# Patient Record
Sex: Female | Born: 1990 | Race: Black or African American | Hispanic: No | Marital: Single | Smoking: Never smoker
Health system: Southern US, Community
[De-identification: ages and names within clinical notes are randomized; demographics above are authoritative.]

## PROBLEM LIST (undated history)

## (undated) ENCOUNTER — Inpatient Hospital Stay (HOSPITAL_COMMUNITY): Payer: Self-pay

## (undated) DIAGNOSIS — B999 Unspecified infectious disease: Secondary | ICD-10-CM

## (undated) DIAGNOSIS — N739 Female pelvic inflammatory disease, unspecified: Secondary | ICD-10-CM

## (undated) DIAGNOSIS — R011 Cardiac murmur, unspecified: Secondary | ICD-10-CM

## (undated) HISTORY — PX: NO PAST SURGERIES: SHX2092

---

## 2013-05-26 NOTE — L&D Delivery Note (Signed)
Delivery Note I was called at 2336, cervix was still 2-3 cm, membranes intact, she had just received her epidural.  I was called at 0037 and told she had SROM and was completely dilated, I left my house within minutes and came directly to the hospital.  When I entered the room, Dr. Macon LargeAnyanwu had delivered Baby A and the baby was on mom's belly.  I delivered baby B and the placenta.     Tracey Hill, Tracey Hill [[id number]][[id number]]  At 12:47 AM a viable female was delivered via Vaginal, Spontaneous Delivery (Presentation: ;  ).  APGAR: 9, 9; weight pending.   Placenta status: Intact, Spontaneous.  Cord:  with the following complications: None.  Anesthesia: Epidural  Episiotomy: None Lacerations: 1st degree Suture Repair: none Est. Blood Loss (mL): 500    Tracey Hill, Tracey Hill [[id number]][[id number]]  At 12:58 AM a viable female was delivered via Vaginal, Spontaneous Delivery (Presentation: Left Occiput Posterior).  APGAR: 8, 9; weight pending.   Placenta status: Intact, Spontaneous.  Cord:  with the following complications: None.  Anesthesia: Epidural  Episiotomy: None Lacerations: 1st degree Suture Repair: none Est. Blood Loss (mL): 500   Mom to postpartum.   Baby A to Couplet care / Skin to Skin.   Baby B to Couplet care / Skin to Skin.  [name] D 03/15/2014, 1:16 AM

## 2013-08-15 ENCOUNTER — Emergency Department (HOSPITAL_COMMUNITY)
Admission: EM | Admit: 2013-08-15 | Discharge: 2013-08-15 | Disposition: A | Discharge status: Home / Self Care | Payer: 59 | Attending: Emergency Medicine | Admitting: Emergency Medicine

## 2013-08-15 ENCOUNTER — Encounter (HOSPITAL_COMMUNITY): Payer: Self-pay | Admitting: Emergency Medicine

## 2013-08-15 DIAGNOSIS — R42 Dizziness and giddiness: Secondary | ICD-10-CM | POA: Diagnosis not present

## 2013-08-15 DIAGNOSIS — Z792 Long term (current) use of antibiotics: Secondary | ICD-10-CM | POA: Insufficient documentation

## 2013-08-15 DIAGNOSIS — O210 Mild hyperemesis gravidarum: Secondary | ICD-10-CM | POA: Diagnosis present

## 2013-08-15 DIAGNOSIS — O2390 Unspecified genitourinary tract infection in pregnancy, unspecified trimester: Secondary | ICD-10-CM | POA: Diagnosis not present

## 2013-08-15 DIAGNOSIS — Z79899 Other long term (current) drug therapy: Secondary | ICD-10-CM | POA: Insufficient documentation

## 2013-08-15 DIAGNOSIS — R5383 Other fatigue: Secondary | ICD-10-CM

## 2013-08-15 DIAGNOSIS — N390 Urinary tract infection, site not specified: Secondary | ICD-10-CM | POA: Insufficient documentation

## 2013-08-15 DIAGNOSIS — O9989 Other specified diseases and conditions complicating pregnancy, childbirth and the puerperium: Secondary | ICD-10-CM | POA: Diagnosis not present

## 2013-08-15 DIAGNOSIS — R5381 Other malaise: Secondary | ICD-10-CM | POA: Diagnosis not present

## 2013-08-15 LAB — CBC WITH DIFFERENTIAL/PLATELET
BASOS ABS: 0 10*3/uL (ref 0.0–0.1)
BASOS PCT: 0 % (ref 0–1)
Eosinophils Absolute: 0 10*3/uL (ref 0.0–0.7)
Eosinophils Relative: 0 % (ref 0–5)
HEMATOCRIT: 38.8 % (ref 36.0–46.0)
Hemoglobin: 14.2 g/dL (ref 12.0–15.0)
LYMPHS PCT: 24 % (ref 12–46)
Lymphs Abs: 2 10*3/uL (ref 0.7–4.0)
MCH: 30.7 pg (ref 26.0–34.0)
MCHC: 36.6 g/dL — AB (ref 30.0–36.0)
MCV: 83.8 fL (ref 78.0–100.0)
Monocytes Absolute: 0.6 10*3/uL (ref 0.1–1.0)
Monocytes Relative: 7 % (ref 3–12)
NEUTROS ABS: 5.5 10*3/uL (ref 1.7–7.7)
NEUTROS PCT: 68 % (ref 43–77)
Platelets: 278 10*3/uL (ref 150–400)
RBC: 4.63 MIL/uL (ref 3.87–5.11)
RDW: 12.6 % (ref 11.5–15.5)
WBC: 8.1 10*3/uL (ref 4.0–10.5)

## 2013-08-15 LAB — URINALYSIS, ROUTINE W REFLEX MICROSCOPIC
Bilirubin Urine: NEGATIVE
Glucose, UA: NEGATIVE mg/dL
Hgb urine dipstick: NEGATIVE
Ketones, ur: >80 mg/dL — AB
NITRITE: NEGATIVE
PH: 5.5 (ref 5.0–8.0)
Protein, ur: 30 mg/dL — AB
SPECIFIC GRAVITY, URINE: 1.034 — AB (ref 1.005–1.030)
UROBILINOGEN UA: 1 mg/dL (ref 0.0–1.0)

## 2013-08-15 LAB — COMPREHENSIVE METABOLIC PANEL
ALT: 16 U/L (ref 0–35)
AST: 16 U/L (ref 0–37)
Albumin: 4.4 g/dL (ref 3.5–5.2)
Alkaline Phosphatase: 56 U/L (ref 39–117)
BUN: 9 mg/dL (ref 6–23)
CO2: 23 meq/L (ref 19–32)
Calcium: 10 mg/dL (ref 8.4–10.5)
Chloride: 101 mEq/L (ref 96–112)
Creatinine, Ser: 0.61 mg/dL (ref 0.50–1.10)
GLUCOSE: 102 mg/dL — AB (ref 70–99)
Potassium: 3.9 mEq/L (ref 3.7–5.3)
SODIUM: 140 meq/L (ref 137–147)
TOTAL PROTEIN: 8.6 g/dL — AB (ref 6.0–8.3)
Total Bilirubin: 0.8 mg/dL (ref 0.3–1.2)

## 2013-08-15 LAB — URINE MICROSCOPIC-ADD ON

## 2013-08-15 LAB — PREGNANCY, URINE: Preg Test, Ur: POSITIVE — AB

## 2013-08-15 MED ORDER — CEPHALEXIN 500 MG PO CAPS: 500.0000 mg | ORAL_CAPSULE | Freq: Two times a day (BID) | ORAL | Status: DC

## 2013-08-15 MED ORDER — ONDANSETRON 4 MG PO TBDP: 4.0000 mg | ORAL_TABLET | Freq: Three times a day (TID) | ORAL | Status: DC | PRN

## 2013-08-15 MED ORDER — SODIUM CHLORIDE 0.9 % IV BOLUS (SEPSIS)
1000.0000 mL | Freq: Once | INTRAVENOUS | Status: AC
  Administered 2013-08-15: 1000 mL via INTRAVENOUS

## 2013-08-15 MED ORDER — ONDANSETRON HCL 4 MG/2ML IJ SOLN
4.0000 mg | INTRAMUSCULAR | Status: AC
  Administered 2013-08-15: 4 mg via INTRAVENOUS
  Filled 2013-08-15: qty 2

## 2013-08-15 MED ORDER — PROMETHAZINE HCL 25 MG/ML IJ SOLN
12.5000 mg | Freq: Once | INTRAMUSCULAR | Status: AC
  Administered 2013-08-15: 12.5 mg via INTRAVENOUS
  Filled 2013-08-15: qty 1

## 2013-08-15 MED ORDER — PROMETHAZINE HCL 25 MG RE SUPP: 25.0000 mg | Freq: Four times a day (QID) | RECTAL | Status: DC | PRN

## 2013-08-15 NOTE — ED Notes (Signed)
Patient given gingerale and instructed to take small and slow sips

## 2013-08-15 NOTE — ED Notes (Signed)
Pt updated on wait time.  

## 2013-08-15 NOTE — ED Notes (Signed)
IV attempted by 2 RNs without success IV team at bedside.

## 2013-08-15 NOTE — ED Notes (Signed)
Pt able to keep fluids down.  

## 2013-08-15 NOTE — ED Provider Notes (Signed)
CSN: [id number]     Arrival date & time 08/15/13  1635 History   First MD Initiated Contact with Patient 08/15/13 2009     Chief Complaint  Patient presents with  . Emesis During Pregnancy     (Consider location/radiation/quality/duration/timing/severity/associated sxs/prior Treatment) HPI Comments: Patient is a 23 year old female who presents for nausea and vomiting x4 days. Patient states that she is [redacted] weeks pregnant, LMP 07/05/13, and that she believes her symptoms are associated with her pregnancy. Patient states she had the same symptoms with a previous pregnancy. Patient has not taken anything for symptoms. She states that any oral intake aggravates her nausea and emesis. Patient states symptoms have been associated with lightheadedness, fatigue, and decreased urinary output. She states that she does not currently have an OB/GYN and has had no prenatal care for this pregnancy. Patient denies associated fever, chest pain, abdominal pain, vaginal bleeding or discharge, dysuria or hematuria, numbness/tingling, weakness, and syncope.  The history is provided by the patient. No language interpreter was used.    History reviewed. No pertinent past medical history. History reviewed. No pertinent past surgical history. No family history on file. History  Substance Use Topics  . Smoking status: Never Smoker   . Smokeless tobacco: Not on file  . Alcohol Use: No   OB History   Grav Para Term Preterm Abortions TAB SAB Ect Mult Living                  Review of Systems  Constitutional: Positive for fatigue. Negative for fever.  Gastrointestinal: Positive for nausea and vomiting. Negative for abdominal pain and diarrhea.  Genitourinary: Negative for dysuria, hematuria, vaginal bleeding and vaginal discharge.  Neurological: Positive for light-headedness. Negative for dizziness, weakness and numbness.  All other systems reviewed and are negative.     Allergies  Review of patient's  allergies indicates no known allergies.  Home Medications   Current Outpatient Rx  Name  Route  Sig  Dispense  Refill  . Prenatal Vit-Fe Fumarate-FA (PRENATAL PO)   Oral   Take 1 tablet by mouth daily.         . cephALEXin (KEFLEX) 500 MG capsule   Oral   Take 1 capsule (500 mg total) by mouth 2 (two) times daily.   14 capsule   0   . ondansetron (ZOFRAN ODT) 4 MG disintegrating tablet   Oral   Take 1 tablet (4 mg total) by mouth every 8 (eight) hours as needed for nausea or vomiting.   15 tablet   0   . promethazine (PHENERGAN) 25 MG suppository   Rectal   Place 1 suppository (25 mg total) rectally every 6 (six) hours as needed for nausea or vomiting (if no relief from zofran).   12 each   0    BP 101/72  Pulse 82  Temp(Src) 98.5 F (36.9 C) (Oral)  Resp 17  Wt 150 lb (68.04 kg)  SpO2 100%  LMP 07/03/2013  Physical Exam  Nursing note and vitals reviewed. Constitutional: She is oriented to person, place, and time. She appears well-developed and well-nourished. No distress.  Nontoxic and nonseptic appearing  HENT:  Head: Normocephalic and atraumatic.  Mouth/Throat: No oropharyngeal exudate.  Oropharynx clear, mucous membranes dry  Eyes: Conjunctivae and EOM are normal. Pupils are equal, round, and reactive to light. No scleral icterus.  Neck: Normal range of motion. Neck supple.  Cardiovascular: Normal rate, regular rhythm and normal heart sounds.   Pulmonary/Chest: Effort normal and  breath sounds normal. No respiratory distress. She has no wheezes. She has no rales.  Abdominal: Soft. She exhibits no distension and no mass. There is no tenderness. There is no rebound and no guarding.  No tenderness or peritoneal signs  Musculoskeletal: Normal range of motion.  Neurological: She is alert and oriented to person, place, and time.  Skin: Skin is warm and dry. No rash noted. She is not diaphoretic. No erythema. No pallor.  Psychiatric: She has a normal mood and  affect. Her behavior is normal.    ED Course  Procedures (including critical care time) Labs Review Labs Reviewed  COMPREHENSIVE METABOLIC PANEL - Abnormal; Notable for the following:    Glucose, Bld 102 (*)    Total Protein 8.6 (*)    All other components within normal limits  CBC WITH DIFFERENTIAL - Abnormal; Notable for the following:    MCHC 36.6 (*)    All other components within normal limits  URINALYSIS, ROUTINE W REFLEX MICROSCOPIC - Abnormal; Notable for the following:    Color, Urine AMBER (*)    APPearance CLOUDY (*)    Specific Gravity, Urine 1.034 (*)    Ketones, ur >80 (*)    Protein, ur 30 (*)    Leukocytes, UA MODERATE (*)    All other components within normal limits  PREGNANCY, URINE - Abnormal; Notable for the following:    Preg Test, Ur POSITIVE (*)    All other components within normal limits  URINE MICROSCOPIC-ADD ON - Abnormal; Notable for the following:    Bacteria, UA FEW (*)    All other components within normal limits   Imaging Review No results found.   EKG Interpretation None      MDM   Final diagnoses:  Hyperemesis gravidarum  UTI (urinary tract infection)    23 year old female presents for nausea and vomiting x4 days. Symptoms associated with pregnancy. Patient states she is [redacted] weeks pregnant. She endorses a history of same with prior pregnancies. No fever, syncope, abdominal pain, vaginal complaints, urinary symptoms, or diarrhea. Physical exam significant only for dry mucous membranes. No abdominal tenderness or peritoneal signs appreciated. Labs without leukocytosis, anemia, electrolyte imbalance, or abnormal liver or kidney function. Urinary tract infection suggests potential infection. Patient will be treated with outpatient course of Keflex.  Nausea and emesis treated in ED with Zofran and Phenergan. Patient also hydrated with 1 L IVF. Patient has been able to tolerate fluids by mouth without emesis. She is stable on orthostatic  testing. Patient stable and appropriate for discharge with instruction to followup with women's outpatient clinic for prenatal care. Will prescribe Zofran for persistent nausea/emesis. Patient also given Phenergan suppositories should she get no relief from Zofran. Return precautions provided and patient agreeable to plan with no unaddressed concerns.   Filed Vitals:   08/15/13 2234 08/15/13 2236 08/15/13 2236 08/15/13 2237  BP: 100/51 101/53 100/61 101/72  Pulse: 80 73 72 82  Temp:      TempSrc:      Resp:      Weight:      SpO2: 100%          Antony MaduraKelly Mariana Goytia, PA-C 08/15/13 2314

## 2013-08-15 NOTE — ED Notes (Signed)
PT reports nausea for about a week and vomiting beginning on Friday. PT reports not being able to keep anything down since Friday. PT states she is [redacted] weeks pregnant and had the same symptoms with previous pregnancy. PT states she was given PR phenergan with 1st pregnancy to stop n/v. PT reports urination x1 today with dark amber color

## 2013-08-15 NOTE — ED Notes (Signed)
2 failed attempts at IV start by RN's.  The IV team has been paged.

## 2013-08-15 NOTE — ED Notes (Signed)
This RN attempted IV access x1 

## 2013-08-15 NOTE — Discharge Instructions (Signed)
Take Keflex for urinary tract infection. Takes Zofran as prescribed for nausea. He may take Phenergan as prescribed Zofran has not provided relief. Drink fluids in small amounts. Avoid milk products and greasy/fatty/spicy foods. Follow up with women's outpatient clinic for prenatal care.  Urinary Tract Infection A urinary tract infection (UTI) can occur any place along the urinary tract. The tract includes the kidneys, ureters, bladder, and urethra. A type of germ called bacteria often causes a UTI. UTIs are often helped with antibiotic medicine.  HOME CARE   If given, take antibiotics as told by your doctor. Finish them even if you start to feel better.  Drink enough fluids to keep your pee (urine) clear or pale yellow.  Avoid tea, drinks with caffeine, and bubbly (carbonated) drinks.  Pee often. Avoid holding your pee in for a long time.  Pee before and after having sex (intercourse).  Wipe from front to back after you poop (bowel movement) if you are a woman. Use each tissue only once. GET HELP RIGHT AWAY IF:   You have back pain.  You have lower belly (abdominal) pain.  You have chills.  You feel sick to your stomach (nauseous).  You throw up (vomit).  Your burning or discomfort with peeing does not go away.  You have a fever.  Your symptoms are not better in 3 days. MAKE SURE YOU:   Understand these instructions.  Will watch your condition.  Will get help right away if you are not doing well or get worse. Document Released: 10/29/2007 Document Revised: 02/04/2012 Document Reviewed: 12/11/2011 [facility] Patient Information 2014 [place], [place]. Hyperemesis Gravidarum Hyperemesis gravidarum is a severe form of nausea and vomiting that happens during pregnancy. Hyperemesis is worse than morning sickness. It may cause you to have nausea or vomiting all day for many days. It may keep you from eating and drinking enough food and liquids. Hyperemesis usually occurs during the  first half (the first 20 weeks) of pregnancy. It often goes away once a woman is in her second half of pregnancy. However, sometimes hyperemesis continues through an entire pregnancy.  CAUSES  The cause of this condition is not completely known but is thought to be related to changes in the body's hormones when pregnant. It could be from the high level of the pregnancy hormone or an increase in estrogen in the body.  SIGNS AND SYMPTOMS   Severe nausea and vomiting.  Nausea that does not go away.  Vomiting that does not allow you to keep any food down.  Weight loss and body fluid loss (dehydration).  Having no desire to eat or not liking food you have previously enjoyed. DIAGNOSIS  Your health care provider will do a physical exam and ask you about your symptoms. He or she may also order blood tests and urine tests to make sure something else is not causing the problem.  TREATMENT  You may only need medicine to control the problem. If medicines do not control the nausea and vomiting, you will be treated in the hospital to prevent dehydration, increased acid in the blood (acidosis), weight loss, and changes in the electrolytes in your body that may harm the unborn baby (fetus). You may need IV fluids.  HOME CARE INSTRUCTIONS   Only take over-the-counter or prescription medicines as directed by your health care provider.  Try eating a couple of dry crackers or toast in the morning before getting out of bed.  Avoid foods and smells that upset your stomach.  Avoid fatty and spicy foods.  Eat 5 6 small meals a day.  Do not drink when eating meals. Drink between meals.  For snacks, eat high-protein foods, such as cheese.  Eat or suck on things that have ginger in them. Ginger helps nausea.  Avoid food preparation. The smell of food can spoil your appetite.  Avoid iron pills and iron in your multivitamins until after 3 4 months of being pregnant. However, consult with your health care  provider before stopping any prescribed iron pills. SEEK MEDICAL CARE IF:   Your abdominal pain increases.  You have a severe headache.  You have vision problems.  You are losing weight. SEEK IMMEDIATE MEDICAL CARE IF:   You are unable to keep fluids down.  You vomit blood.  You have constant nausea and vomiting.  You have excessive weakness.  You have extreme thirst.  You have dizziness or fainting.  You have a fever or persistent symptoms for more than 2 3 days.  You have a fever and your symptoms suddenly get worse. MAKE SURE YOU:   Understand these instructions.  Will watch your condition.  Will get help right away if you are not doing well or get worse. Document Released: 05/12/2005 Document Revised: 03/02/2013 Document Reviewed: 12/22/2012 [facility] Patient Information 2014 [place], [place].  Emergency Department Resource Guide 1) Find a Doctor and Pay Out of Pocket Although you won't have to find out who is covered by your insurance plan, it is a good idea to ask around and get recommendations. You will then need to call the office and see if the doctor you have chosen will accept you as a new patient and what types of options they offer for patients who are self-pay. Some doctors offer discounts or will set up payment plans for their patients who do not have insurance, but you will need to ask so you aren't surprised when you get to your appointment.  2) Contact Your Local Health Department Not all health departments have doctors that can see patients for sick visits, but many do, so it is worth a call to see if yours does. If you don't know where your local health department is, you can check in your phone book. The CDC also has a tool to help you locate your state's health department, and many state websites also have listings of all of their local health departments.  3) Find a Walk-in Clinic If your illness is not likely to be very severe or complicated, you may  want to try a walk in clinic. These are popping up all over the country in pharmacies, drugstores, and shopping centers. They're usually staffed by nurse practitioners or physician assistants that have been trained to treat common illnesses and complaints. They're usually fairly quick and inexpensive. However, if you have serious medical issues or chronic medical problems, these are probably not your best option.  No Primary Care Doctor: - Call Health Connect at  [phone] - they can help you locate a primary care doctor that  accepts your insurance, provides certain services, etc. - Physician Referral Service- [phone]  Chronic Pain Problems: Organization         Address  Phone   Notes  [facility]  [phone] Patients need to be referred by their primary care doctor.   Medication Assistance: Organization         Address  Phone   Notes  [facility] Medication Assistance Program [address] [place], [place] [address] [phone]) [phone] --Must be a resident of [place] -- Must have NO insurance coverage whatsoever (no Medicaid/ Medicare, etc.) -- The pt. MUST have a primary care doctor that directs their care regularly and follows them in the community   Med[facility]ist  [phone]   [employer]  [phone]    Agencies that provide inexpensive medical care: Organization         Address  Phone   Notes  [facility]  [phone]) [phone]   [facility]    [phone]   [facility] [address] [place], [place] [address] [phone]) [phone]   [facility] [address], [place] [phone]) [phone]   [facility]anned Parenthood    [phone](336) [phone]   [facility]    [phone]   [facility]  [address], [place] Phone:  [phone], Fax:  [phone]) [phone] Hours of Operation:  9 am - 6 pm, M-F.  Also accepts Medicaid/Medicare and self-pay.   [facility]  [address], [place] Phone: [phone], Fax: [phone]. Hours of Operation:  8:30 am - 5:30 pm, M-F.  Also accepts Medicaid and self-pay.  [facility] [place] [address], [place] Phone: [phone]) [phone]   [facility] [address], [place], [place] [phone], Ext. [phone] Mondays & Thursdays: 7-9 AM.  First 15 patients are seen on a first come, first serve basis.    Medicaid-accepting [facility] Providers:  Organization         Address  Phone   Notes  [facility] [address], [place] [phone]) [phone] Also accepts self-pay patients.  [facility] [address], [place]  [phone]) [phone]   [facility] [address], [place] [phone]   [facility] [address] [place] [address], [place] [phone]) [phone]   Renaye RakersVeita Bland [address], [place]   [phone] Only accepts [place] Access [place] patients after they have their name applied to their card.   Self-Pay (no insurance) in [place]:  Organization         Address  Phone   Notes  Sickle Cell Patients, [facility] [address], [place] [phone]) [phone]   [facility] [address], [place] [phone]   [facility] [place]  [address], [place] [phone]) [phone]   [facility]/Dr. Osei-Bonsu  [address] [place] [address], [place] or [phone], [place] [phone] Phone number for both [place] and [place] locations is the same.  [facility] [address], [place] [phone]   [facility] [address] [place] [address], [place] or [address] [phone]) [phone]   [facility] [address], [place] [phone]) [phone], phone; [phone](336)  [phone], fax Sees patients 1st and 3rd Saturday of every month.  Must not qualify for public or private insurance (i.e. Medicaid, Medicare, [place] Health Choice, Veterans' Benefits)  Household income should be no more than 200% of the poverty level The clinic cannot treat you if you are pregnant or think you are pregnant  Sexually transmitted diseases are not treated at the clinic.    Dental Care:  Organization         Address  Phone  Notes  [facility] Department of [facility] [address] [place], [place] [phone] Accepts children up to age 85 who are enrolled in [place] or [place] Health Choice; pregnant women with a Medicaid card; and children who have applied for Medicaid or [place] Health Choice, but were declined, whose parents can pay a reduced fee at time of service.  [facility]  [address], [place] [phone] Accepts children up to age 69 who are enrolled in [place] or [place] Health Choice; pregnant women with a Medicaid card; and children who have applied for Medicaid or [place] Health Choice, but were declined, whose parents can pay a reduced fee at time of service.  [facility]  [address] [place], [place] [phone] Patients are seen by appointment only. Walk-ins are not accepted. [facility] will see patients 18 years of age and older. Monday - Tuesday (8am-5pm) Most Wednesdays (8:30-5pm) $30 per visit, cash only  [facility] Adult Dental Access PROGRAM  [address], [facility] [phone] Patients are seen by appointment only. Walk-ins are not accepted. [facility] will see patients 21 years of age and older. One Wednesday Evening (Monthly: Volunteer Based).  $30 per visit, cash only  [employer]  [phone] for adults; Children under age 72, call Graduate Pediatric Dentistry at [phone]. Children aged 61-14, please call [phone] to request a pediatric application.  Dental services are provided in all areas of dental care including fillings, crowns and bridges, complete and partial dentures, implants, gum treatment, root canals, and extractions. Preventive care is also provided. Treatment is provided to both adults and children. Patients are selected via a lottery and there is often a waiting list.   [facility] [address] [place]  [phone] [email or web address]   [facility] [address] [place], [place] [phone], Ext. [phone] Second and Fourth Thursday of each month, opens at 6:30 AM; Clinic ends at 9 AM.  Patients are seen on a first-come first-served basis, and a limited number are seen during each clinic.   [facility]  [address] [place], [place] [phone]   Eligibility Requirements You must have lived in [place], [place], or [place] counties for at least the last three months.   You cannot be eligible for state or federal sponsored [place], including [employer], [place], or [employer].   You generally cannot be eligible for healthcare insurance through your employer.    How to apply: Eligibility screenings are held every Tuesday and Wednesday afternoon from 1:00 pm until 4:00 pm. You do not need an appointment for the interview!  [facility] [address] [place], [place] [phone]   [facility] Health Department  [phone]   [facility] Health Department  [phone]   [facility] Health Department  [phone]    Behavioral Health Resources in the Community: Intensive Outpatient Programs Organization         Address  Phone  Notes  [facility] Services [address], [place], [place] [phone]   [facility] Outpatient [address] [place], [place] [phone]   [facility] [address], [place], [place]  [phone]    [facility] 352 Mental Health [address]  [place],  [place] [phone] or [phone]   Substance Abuse Resources Organization         Address  Phone  Notes  [facility] Drug Services  [phone]   [facility]  [phone]   [facility] [place]  [phone]   Floydene Flock  [phone]   Residential & Outpatient Substance Abuse Program  4121956210   Psychological Services Organization         Address  Phone  Notes  [facility] Behavioral Health  [phone]   [facility] Services  [phone]   [facility] Mental Health [address], [place] [phone] or [phone]    Mobile Crisis Teams Organization         Address  Phone  Notes  [facility], Mobile Crisis Care Unit  [phone]   [facility]  [address] [place], [place] [phone]   Doristine Locks [address] [place] [phone]    Self-Help/Support Groups Organization         Address  Phone             Notes  [facility] [place] - variety of support groups  [phone]- I7437963 Call for more information  Narcotics Anonymous (NA), [facility] [address], [employer] [facility]  2 meetings at this location   Statistician         Address  Phone  Notes  [facility] [address],    [place]  [phone]   [facility]  [address], [place] [address], [place], [place] [phone]   [facility] [address] [place], [place] [phone] Admissions: 8am-3pm M-F  [facility] [address],    [place], [place] [phone]   [facility] [address] [place], [employer], [place] [phone]   [facility] [address],  [place], [place] [phone]   [facility] - Intensive Outpatient [address], [place], [place] [phone]   [facility] ([facility] [address]  [place], [place] [phone] or [phone]   [facility] ([facility]) [address] [place] [address], [place], [place] [phone] Accepts Medicaid  [facility] [address]  [place] [phone] Substance Abuse/Addiction Treatment   [facility] Organization         Address  Phone  Notes  [facility]  [phone]   Angie Fava, PhD [address] [place], [place]   [phone] or [phone]   [facility]   [address] [place], [place] [phone]   [facility] [address] [place], [place] [phone] Insurance/Medicaid/sponsorship through [facility] and Families [address]                                    [place], [place] [phone] Therapy/tele-psych/case  [facility] [address], [place] [phone]    Dr. Lolly Mustache  [phone]   [facility]  [employer] [facility] Dept. 1) [address] [place] 2) [address], [place] 3)  [address], [place] [phone]  [phone]   [facility] Child Abuse Hotline (726)506-2029 or [phone] (After Hours)

## 2013-08-19 NOTE — ED Provider Notes (Signed)
Medical screening examination/treatment/procedure(s) were performed by non-physician practitioner and as supervising physician I was immediately available for consultation/collaboration.  Jaevin Medearis T Lennart Gladish, MD 08/19/13 0754 

## 2013-09-01 ENCOUNTER — Inpatient Hospital Stay (HOSPITAL_COMMUNITY): Payer: 59

## 2013-09-01 ENCOUNTER — Inpatient Hospital Stay (HOSPITAL_COMMUNITY)
Admission: AD | Admit: 2013-09-01 | Discharge: 2013-09-01 | Disposition: A | Discharge status: Home / Self Care | Payer: 59 | Source: Ambulatory Visit | Attending: Family Medicine | Admitting: Family Medicine

## 2013-09-01 ENCOUNTER — Encounter (HOSPITAL_COMMUNITY): Payer: Self-pay | Admitting: *Deleted

## 2013-09-01 DIAGNOSIS — N390 Urinary tract infection, site not specified: Secondary | ICD-10-CM

## 2013-09-01 DIAGNOSIS — O219 Vomiting of pregnancy, unspecified: Secondary | ICD-10-CM

## 2013-09-01 DIAGNOSIS — O210 Mild hyperemesis gravidarum: Secondary | ICD-10-CM | POA: Insufficient documentation

## 2013-09-01 DIAGNOSIS — IMO0001 Reserved for inherently not codable concepts without codable children: Secondary | ICD-10-CM

## 2013-09-01 DIAGNOSIS — O2390 Unspecified genitourinary tract infection in pregnancy, unspecified trimester: Secondary | ICD-10-CM | POA: Insufficient documentation

## 2013-09-01 DIAGNOSIS — O30009 Twin pregnancy, unspecified number of placenta and unspecified number of amniotic sacs, unspecified trimester: Secondary | ICD-10-CM | POA: Insufficient documentation

## 2013-09-01 HISTORY — DX: Female pelvic inflammatory disease, unspecified: N73.9

## 2013-09-01 LAB — URINALYSIS, ROUTINE W REFLEX MICROSCOPIC
Bilirubin Urine: NEGATIVE
GLUCOSE, UA: NEGATIVE mg/dL
Ketones, ur: >80 mg/dL — AB
NITRITE: NEGATIVE
Protein, ur: 30 mg/dL — AB
Urobilinogen, UA: 0.2 mg/dL (ref 0.0–1.0)
pH: 6 (ref 5.0–8.0)

## 2013-09-01 LAB — BASIC METABOLIC PANEL
BUN: 8 mg/dL (ref 6–23)
CALCIUM: 9.4 mg/dL (ref 8.4–10.5)
CO2: 21 meq/L (ref 19–32)
Chloride: 100 mEq/L (ref 96–112)
Creatinine, Ser: 0.48 mg/dL — ABNORMAL LOW (ref 0.50–1.10)
GFR calc Af Amer: >90 mL/min (ref >90)
GFR calc non Af Amer: >90 mL/min (ref >90)
GLUCOSE: 79 mg/dL (ref 70–99)
Potassium: 3.5 mEq/L — ABNORMAL LOW (ref 3.7–5.3)
SODIUM: 137 meq/L (ref 137–147)

## 2013-09-01 LAB — CBC
HCT: 35.3 % — ABNORMAL LOW (ref 36.0–46.0)
Hemoglobin: 12.8 g/dL (ref 12.0–15.0)
MCH: 30.4 pg (ref 26.0–34.0)
MCHC: 36.3 g/dL — ABNORMAL HIGH (ref 30.0–36.0)
MCV: 83.8 fL (ref 78.0–100.0)
Platelets: 242 10*3/uL (ref 150–400)
RBC: 4.21 MIL/uL (ref 3.87–5.11)
RDW: 12.5 % (ref 11.5–15.5)
WBC: 7 10*3/uL (ref 4.0–10.5)

## 2013-09-01 LAB — URINE MICROSCOPIC-ADD ON

## 2013-09-01 MED ORDER — METOCLOPRAMIDE HCL 5 MG/ML IJ SOLN
10.0000 mg | Freq: Once | INTRAMUSCULAR | Status: AC
  Administered 2013-09-01: 10 mg via INTRAVENOUS
  Filled 2013-09-01: qty 2

## 2013-09-01 MED ORDER — PROMETHAZINE HCL 25 MG PO TABS: 25.0000 mg | ORAL_TABLET | Freq: Once | ORAL | Status: DC

## 2013-09-01 MED ORDER — NITROFURANTOIN MONOHYD MACRO 100 MG PO CAPS: 100.0000 mg | ORAL_CAPSULE | Freq: Two times a day (BID) | ORAL | Status: DC

## 2013-09-01 MED ORDER — PROMETHAZINE HCL 25 MG PO TABS: 25.0000 mg | ORAL_TABLET | Freq: Four times a day (QID) | ORAL | Status: DC | PRN

## 2013-09-01 MED ORDER — LACTATED RINGERS IV BOLUS (SEPSIS)
1000.0000 mL | Freq: Once | INTRAVENOUS | Status: AC
  Administered 2013-09-01: 1000 mL via INTRAVENOUS

## 2013-09-01 MED ORDER — PROMETHAZINE HCL 25 MG/ML IJ SOLN
25.0000 mg | Freq: Once | INTRAMUSCULAR | Status: AC
  Administered 2013-09-01: 25 mg via INTRAVENOUS
  Filled 2013-09-01: qty 1

## 2013-09-01 NOTE — MAU Note (Addendum)
[facility], was dx as preg and with hyperemesis.  Was given zofran- which she is not taking because she is afraid to take; and phenergan supp- doesn't seem to work, makes her have a BM so it doesn't stay in .  Can't keep anything down

## 2013-09-01 NOTE — MAU Provider Note (Signed)
Attestation of Attending Supervision of Advanced Practitioner (PA/CNM/NP): Evaluation and management procedures were performed by the Advanced Practitioner under my supervision and collaboration.  I have reviewed the Advanced Practitioner's note and chart, and I agree with the management and plan.  Reva Boresanya S Killian Ress, MD [facility] Attending 09/01/2013 11:41 PM

## 2013-09-01 NOTE — MAU Provider Note (Signed)
CSN: [id number]     Arrival date & time 09/01/13  1811 History   None    Chief Complaint  Patient presents with  . Nausea     (Consider location/radiation/quality/duration/timing/severity/associated sxs/prior Treatment) HPI Tracey Hill is a 23 y.o. G1P0 @ 3029w4d who presents to MAU with nausea and vomiting in pregnancy. [facility] and treated with IV fluids, and IV medications for nausea and vomiting. She was discharge home with Zofran tablets and Phenergan Supp. The patient was doing well until she saw the commercials on TV about Zofran and birth defects so she stopped the medication. She can not keep the suppository in. She states that every time she puts it in she feels the need to have a BM. She is here requesting phenergan tablets for her nausea. She denies abdominal pain, UTI symptoms or other problems tonight.  She is vomiting about 10 times per day.   No past medical history on file. No past surgical history on file. No family history on file. History  Substance Use Topics  . Smoking status: Never Smoker   . Smokeless tobacco: Not on file  . Alcohol Use: No   OB History   Grav Para Term Preterm Abortions TAB SAB Ect Mult Living   1              Review of Systems Negative except as stated in HPI.   Allergies  Review of patient's allergies indicates no known allergies.  Home Medications  No current outpatient prescriptions on file. BP 117/62  Pulse 72  Temp(Src) 98 F (36.7 C)  Resp 18  Ht 5\' 3"  (1.6 m)  Wt 150 lb 9.6 oz (68.312 kg)  BMI 26.68 kg/m2  SpO2 100%  LMP 07/03/2013 Physical Exam  Nursing note and vitals reviewed. Constitutional: She is oriented to person, place, and time. She appears well-developed and well-nourished.  Eyes: EOM are normal.  Neck: Neck supple.  Cardiovascular: Normal rate.   Pulmonary/Chest: Effort normal.  Abdominal: Soft. There is tenderness.  Mildly tender lower abdomen  Musculoskeletal:  Normal range of motion.  Neurological: She is alert and oriented to person, place, and time. No cranial nerve deficit.  Skin: Skin is warm and dry.  Psychiatric: She has a normal mood and affect. Her behavior is normal.   Results for orders placed during the hospital encounter of 09/01/13 (from the past 24 hour(s))  URINALYSIS, ROUTINE W REFLEX MICROSCOPIC     Status: Abnormal   Collection Time    09/01/13  6:30 PM      Result Value Ref Range   Color, Urine YELLOW  YELLOW   APPearance HAZY (*) CLEAR   Specific Gravity, Urine >1.030 (*) 1.005 - 1.030   pH 6.0  5.0 - 8.0   Glucose, UA NEGATIVE  NEGATIVE mg/dL   Hgb urine dipstick TRACE (*) NEGATIVE   Bilirubin Urine NEGATIVE  NEGATIVE   Ketones, ur >80 (*) NEGATIVE mg/dL   Protein, ur 30 (*) NEGATIVE mg/dL   Urobilinogen, UA 0.2  0.0 - 1.0 mg/dL   Nitrite NEGATIVE  NEGATIVE   Leukocytes, UA SMALL (*) NEGATIVE  URINE MICROSCOPIC-ADD ON     Status: Abnormal   Collection Time    09/01/13  6:30 PM      Result Value Ref Range   Squamous Epithelial / LPF FEW (*) RARE   WBC, UA 11-20  <3 WBC/hpf   RBC / HPF 3-6  <3 RBC/hpf  Bacteria, UA RARE  RARE   Urine-Other MUCOUS PRESENT    CBC     Status: Abnormal   Collection Time    09/01/13  8:10 PM      Result Value Ref Range   WBC 7.0  4.0 - 10.5 K/uL   RBC 4.21  3.87 - 5.11 MIL/uL   Hemoglobin 12.8  12.0 - 15.0 g/dL   HCT 16.1 (*) 09.6 - 04.5 %   MCV 83.8  78.0 - 100.0 fL   MCH 30.4  26.0 - 34.0 pg   MCHC 36.3 (*) 30.0 - 36.0 g/dL   RDW 40.9  81.1 - 91.4 %   Platelets 242  150 - 400 K/uL    ED Course  Procedures   MDM  23 y.o. female with nausea and vomiting in early pregnancy. Care turned over to Jannifer Rodney, NP, patient receiving IV fluids and Reglan. Awaiting ultrasound.   US Ob Comp Less 14 Wks  09/01/2013   CLINICAL DATA:  Hyperemesis. Gestational age [redacted] weeks 4 days per LMP. Patient refused transvaginal exam.  EXAM: TWIN OBSTETRICAL ULTRASOUND <14 WKS  COMPARISON:   None.  FINDINGS: TWIN 1  Intrauterine gestational sac: Visualized/normal in shape.  Yolk sac:  Visualized.  Embryo:  Visualized.  Cardiac Activity: Visualized.  Heart Rate: 165 bpm  CRL:   22.2  mm   9 w 0 d                  [place] EDC: 04/06/2014  TWIN 2  Intrauterine gestational sac: Visualized/normal in shape.  Yolk sac:  Visualized.  Embryo:  Visualized.  Cardiac Activity: Visualized.  Heart Rate: 176 bpm  CRL:   23  mm   9 w 0 d                  [place] EDC: 04/06/2014  Maternal uterus/adnexae: No evidence of subchorionic hemorrhage. Ovaries are not visualized. No free pelvic fluid noted.  IMPRESSION: Findings compatible would live twin gestation with estimated gestational age of each twin 9 weeks 0 days.   Electronically Signed   By: Elberta Fortis M.D.   On: 09/01/2013 20:52    A: Hyperemesis with twin gestation UTI  P: IVF x 2 liters Declines any further medications tonight Macrobid 100mg  BID x 7 days Phenergan 25 mg po q 6 hours prn Increase fluids Establish care asap Return to MAU if needed

## 2013-09-01 NOTE — Discharge Instructions (Signed)
Urinary Tract Infection  Urinary tract infections (UTIs) can develop anywhere along your urinary tract. Your urinary tract is your body's drainage system for removing wastes and extra water. Your urinary tract includes two kidneys, two ureters, a bladder, and a urethra. Your kidneys are a pair of bean-shaped organs. Each kidney is about the size of your fist. They are located below your ribs, one on each side of your spine.  CAUSES  Infections are caused by microbes, which are microscopic organisms, including fungi, viruses, and bacteria. These organisms are so small that they can only be seen through a microscope. Bacteria are the microbes that most commonly cause UTIs.  SYMPTOMS   Symptoms of UTIs may vary by age and gender of the patient and by the location of the infection. Symptoms in young women typically include a frequent and intense urge to urinate and a painful, burning feeling in the bladder or urethra during urination. Older women and men are more likely to be tired, shaky, and weak and have muscle aches and abdominal pain. A fever may mean the infection is in your kidneys. Other symptoms of a kidney infection include pain in your back or sides below the ribs, nausea, and vomiting.  DIAGNOSIS  To diagnose a UTI, your caregiver will ask you about your symptoms. Your caregiver also will ask to provide a urine sample. The urine sample will be tested for bacteria and white blood cells. White blood cells are made by your body to help fight infection.  TREATMENT   Typically, UTIs can be treated with medication. Because most UTIs are caused by a bacterial infection, they usually can be treated with the use of antibiotics. The choice of antibiotic and length of treatment depend on your symptoms and the type of bacteria causing your infection.  HOME CARE INSTRUCTIONS   If you were prescribed antibiotics, take them exactly as your caregiver instructs you. Finish the medication even if you feel better after you  have only taken some of the medication.   Drink enough water and fluids to keep your urine clear or pale yellow.   Avoid caffeine, tea, and carbonated beverages. They tend to irritate your bladder.   Empty your bladder often. Avoid holding urine for long periods of time.   Empty your bladder before and after sexual intercourse.   After a bowel movement, women should cleanse from front to back. Use each tissue only once.  SEEK MEDICAL CARE IF:    You have back pain.   You develop a fever.   Your symptoms do not begin to resolve within 3 days.  SEEK IMMEDIATE MEDICAL CARE IF:    You have severe back pain or lower abdominal pain.   You develop chills.   You have nausea or vomiting.   You have continued burning or discomfort with urination.  MAKE SURE YOU:    Understand these instructions.   Will watch your condition.   Will get help right away if you are not doing well or get worse.  Document Released: 02/19/2005 Document Revised: 11/11/2011 Document Reviewed: 06/20/2011  ExitCare Patient Information 2014 [employer].  Nausea and Vomiting  Nausea is a sick feeling that often comes before throwing up (vomiting). Vomiting is a reflex where stomach contents come out of your mouth. Vomiting can cause severe loss of body fluids (dehydration). Children and elderly adults can become dehydrated quickly, especially if they also have diarrhea. Nausea and vomiting are symptoms of a condition or disease. It is important   to find the cause of your symptoms.  CAUSES    Direct irritation of the stomach lining. This irritation can result from increased acid production (gastroesophageal reflux disease), infection, food poisoning, taking certain medicines (such as nonsteroidal anti-inflammatory drugs), alcohol use, or tobacco use.   Signals from the brain.These signals could be caused by a headache, heat exposure, an inner ear disturbance, increased pressure in the brain from injury, infection, a tumor, or a  concussion, pain, emotional stimulus, or metabolic problems.   An obstruction in the gastrointestinal tract (bowel obstruction).   Illnesses such as diabetes, hepatitis, gallbladder problems, appendicitis, kidney problems, cancer, sepsis, atypical symptoms of a heart attack, or eating disorders.   Medical treatments such as chemotherapy and radiation.   Receiving medicine that makes you sleep (general anesthetic) during surgery.  DIAGNOSIS  Your caregiver may ask for tests to be done if the problems do not improve after a few days. Tests may also be done if symptoms are severe or if the reason for the nausea and vomiting is not clear. Tests may include:   Urine tests.   Blood tests.   Stool tests.   Cultures (to look for evidence of infection).   X-rays or other imaging studies.  Test results can help your caregiver make decisions about treatment or the need for additional tests.  TREATMENT  You need to stay well hydrated. Drink frequently but in small amounts.You may wish to drink water, sports drinks, clear broth, or eat frozen ice pops or gelatin dessert to help stay hydrated.When you eat, eating slowly may help prevent nausea.There are also some antinausea medicines that may help prevent nausea.  HOME CARE INSTRUCTIONS    Take all medicine as directed by your caregiver.   If you do not have an appetite, do not force yourself to eat. However, you must continue to drink fluids.   If you have an appetite, eat a normal diet unless your caregiver tells you differently.   Eat a variety of complex carbohydrates (rice, wheat, potatoes, bread), lean meats, yogurt, fruits, and vegetables.   Avoid high-fat foods because they are more difficult to digest.   Drink enough water and fluids to keep your urine clear or pale yellow.   If you are dehydrated, ask your caregiver for specific rehydration instructions. Signs of dehydration may include:   Severe thirst.   Dry lips and mouth.   Dizziness.   Dark  urine.   Decreasing urine frequency and amount.   Confusion.   Rapid breathing or pulse.  SEEK IMMEDIATE MEDICAL CARE IF:    You have blood or brown flecks (like coffee grounds) in your vomit.   You have black or bloody stools.   You have a severe headache or stiff neck.   You are confused.   You have severe abdominal pain.   You have chest pain or trouble breathing.   You do not urinate at least once every 8 hours.   You develop cold or clammy skin.   You continue to vomit for longer than 24 to 48 hours.   You have a fever.  MAKE SURE YOU:    Understand these instructions.   Will watch your condition.   Will get help right away if you are not doing well or get worse.  Document Released: 05/12/2005 Document Revised: 08/04/2011 Document Reviewed: 10/09/2010  ExitCare Patient Information 2014 [employer].

## 2013-09-03 ENCOUNTER — Encounter (HOSPITAL_COMMUNITY): Payer: Self-pay

## 2013-09-03 ENCOUNTER — Inpatient Hospital Stay (HOSPITAL_COMMUNITY)
Admission: AD | Admit: 2013-09-03 | Discharge: 2013-09-04 | Disposition: A | Discharge status: Home / Self Care | Payer: Medicaid Other | Source: Ambulatory Visit | Attending: Obstetrics & Gynecology | Admitting: Obstetrics & Gynecology

## 2013-09-03 DIAGNOSIS — O30009 Twin pregnancy, unspecified number of placenta and unspecified number of amniotic sacs, unspecified trimester: Secondary | ICD-10-CM | POA: Insufficient documentation

## 2013-09-03 DIAGNOSIS — A599 Trichomoniasis, unspecified: Secondary | ICD-10-CM

## 2013-09-03 DIAGNOSIS — O210 Mild hyperemesis gravidarum: Secondary | ICD-10-CM | POA: Insufficient documentation

## 2013-09-03 DIAGNOSIS — O98819 Other maternal infectious and parasitic diseases complicating pregnancy, unspecified trimester: Secondary | ICD-10-CM | POA: Insufficient documentation

## 2013-09-03 DIAGNOSIS — A598 Trichomoniasis of other sites: Secondary | ICD-10-CM | POA: Insufficient documentation

## 2013-09-03 LAB — URINALYSIS, ROUTINE W REFLEX MICROSCOPIC
Glucose, UA: NEGATIVE mg/dL
Ketones, ur: >80 mg/dL — AB
Nitrite: NEGATIVE
Protein, ur: 30 mg/dL — AB
Urobilinogen, UA: 0.2 mg/dL (ref 0.0–1.0)
pH: 6 (ref 5.0–8.0)

## 2013-09-03 LAB — URINE MICROSCOPIC-ADD ON

## 2013-09-03 LAB — URINE CULTURE
COLONY COUNT: NO GROWTH
Culture: NO GROWTH

## 2013-09-03 MED ORDER — LACTATED RINGERS IV BOLUS (SEPSIS)
1000.0000 mL | Freq: Once | INTRAVENOUS | Status: AC
  Administered 2013-09-03: 1000 mL via INTRAVENOUS

## 2013-09-03 MED ORDER — PROCHLORPERAZINE EDISYLATE 5 MG/ML IJ SOLN
10.0000 mg | Freq: Once | INTRAMUSCULAR | Status: AC
  Administered 2013-09-03: 10 mg via INTRAVENOUS
  Filled 2013-09-03: qty 2

## 2013-09-03 NOTE — MAU Provider Note (Signed)
History     CSN: [id number]  Arrival date and time: 09/03/13 2200   First Provider Initiated Contact with Patient 09/03/13 2228      Chief Complaint  Patient presents with  . Morning Sickness   HPI Comments: Tracey Hill 23 y.o. 5669w6d twins G3P1011 presents to MAU with nausea and vomiting ongoing since her last visit 2 days ago. She has been taking phenergan tablets and suppositories and says really the only thing that has really helped is zofran. She is willing to accept the isks at this point. She will be starting a new job Monday, has a 24 year old and needs to be able to get this under control. She has lost 2 lbs since last visit.      Past Medical History  Diagnosis Date  . Pelvic inflammatory disease (PID)     Past Surgical History  Procedure Laterality Date  . No past surgeries      Family History  Problem Relation Age of Onset  . Hypertension Father     History  Substance Use Topics  . Smoking status: Never Smoker   . Smokeless tobacco: Never Used  . Alcohol Use: No    Allergies: No Known Allergies  Prescriptions prior to admission  Medication Sig Dispense Refill  . Prenatal Vit-Fe Fumarate-FA (PRENATAL PO) Take 1 tablet by mouth daily.      . promethazine (PHENERGAN) 25 MG suppository Place 1 suppository (25 mg total) rectally every 6 (six) hours as needed for nausea or vomiting (if no relief from zofran).  12 each  0  . promethazine (PHENERGAN) 25 MG tablet Take 1 tablet (25 mg total) by mouth every 6 (six) hours as needed for nausea or vomiting.  30 tablet  0  . nitrofurantoin, macrocrystal-monohydrate, (MACROBID) 100 MG capsule Take 1 capsule (100 mg total) by mouth 2 (two) times daily.  14 capsule  0    Review of Systems  Constitutional: Negative.   HENT: Negative.   Eyes: Negative.   Respiratory: Negative.   Cardiovascular: Negative.   Gastrointestinal: Positive for nausea and vomiting.  Genitourinary: Negative.   Musculoskeletal: Negative.    Skin: Negative.   Neurological: Negative.   Psychiatric/Behavioral: Negative.    Physical Exam   Blood pressure 119/58, pulse 79, temperature 98.3 F (36.8 C), temperature source Oral, resp. rate 20, height 5\' 3"  (1.6 m), weight 148 lb (67.132 kg), last menstrual period 07/03/2013.  Physical Exam  Constitutional: She appears well-developed and well-nourished. No distress.  HENT:  Head: Normocephalic and atraumatic.  Eyes: Pupils are equal, round, and reactive to light.  Cardiovascular: Normal rate, regular rhythm and normal heart sounds.   Respiratory: Effort normal and breath sounds normal. No respiratory distress.  GI: Soft. Bowel sounds are normal.  Musculoskeletal: Normal range of motion.  Neurological: She is alert.  Skin: Skin is warm and dry.  Psychiatric: She has a normal mood and affect. Her behavior is normal. Thought content normal.   Results for orders placed during the hospital encounter of 09/03/13 (from the past 24 hour(s))  URINALYSIS, ROUTINE W REFLEX MICROSCOPIC     Status: Abnormal   Collection Time    09/03/13 10:10 PM      Result Value Ref Range   Color, Urine YELLOW  YELLOW   APPearance CLEAR  CLEAR   Specific Gravity, Urine >1.030 (*) 1.005 - 1.030   pH 6.0  5.0 - 8.0   Glucose, UA NEGATIVE  NEGATIVE mg/dL   Hgb urine dipstick  TRACE (*) NEGATIVE   Bilirubin Urine SMALL (*) NEGATIVE   Ketones, ur >80 (*) NEGATIVE mg/dL   Protein, ur 30 (*) NEGATIVE mg/dL   Urobilinogen, UA 0.2  0.0 - 1.0 mg/dL   Nitrite NEGATIVE  NEGATIVE   Leukocytes, UA SMALL (*) NEGATIVE  URINE MICROSCOPIC-ADD ON     Status: Abnormal   Collection Time    09/03/13 10:10 PM      Result Value Ref Range   Squamous Epithelial / LPF FEW (*) RARE   WBC, UA 3-6  <3 WBC/hpf   RBC / HPF 3-6  <3 RBC/hpf   Bacteria, UA FEW (*) RARE   Urine-Other TRICHOMONAS PRESENT    WET PREP, GENITAL     Status: Abnormal   Collection Time    09/04/13  1:20 AM      Result Value Ref Range   Yeast Wet  Prep HPF POC NONE SEEN  NONE SEEN   Trich, Wet Prep NONE SEEN  NONE SEEN   Clue Cells Wet Prep HPF POC MODERATE (*) NONE SEEN   WBC, Wet Prep HPF POC FEW (*) NONE SEEN     MAU Course  Procedures  MDM  2 liters  LR Compazine 10 mg Called Dr Macon Large for Zofran approval  Assessment and Plan   A: Hyperemesis with twin pregnancy Trichomonas in urine sample/ not vaginal  P: Zofran 8 mg ODT q6 hours prn nausea Flagyl 2 grams po tonight Flagyl 2 Grams po for partner Kathreen Devoid Fluids/ rest Establish care asap   Delbert Phenix 09/04/2013, 1:45 AM

## 2013-09-03 NOTE — MAU Note (Signed)
Throwing up constantly. Was here on Thurs and given meds for nausea but not helping.

## 2013-09-04 LAB — WET PREP, GENITAL
Trich, Wet Prep: NONE SEEN
Yeast Wet Prep HPF POC: NONE SEEN

## 2013-09-04 MED ORDER — METRONIDAZOLE 500 MG PO TABS
2000.0000 mg | ORAL_TABLET | Freq: Once | ORAL | Status: AC
  Administered 2013-09-04: 2000 mg via ORAL
  Filled 2013-09-04: qty 4

## 2013-09-04 MED ORDER — ONDANSETRON 8 MG PO TBDP: 8.0000 mg | ORAL_TABLET | Freq: Three times a day (TID) | ORAL | Status: DC | PRN

## 2013-09-04 MED ORDER — LACTATED RINGERS IV BOLUS (SEPSIS)
1000.0000 mL | Freq: Once | INTRAVENOUS | Status: AC
  Administered 2013-09-04: 1000 mL via INTRAVENOUS

## 2013-09-04 NOTE — Progress Notes (Signed)
Linda Barefoot NP in earlier to discuss test results and d/c plan. Written and verbal d/c instructions given and understanding voiced. 

## 2013-09-04 NOTE — Discharge Instructions (Signed)
Hyperemesis Gravidarum Diet Hyperemesis gravidarum is a severe form of morning sickness. It is characterized by frequent and severe vomiting. It happens during the first trimester of pregnancy. It may be caused by the rapid hormone changes that happen during pregnancy. It is associated with a 5% weight loss of pre-pregnancy weight. The hyperemesis diet may be used to lessen symptoms of nausea and vomiting. EATING GUIDELINES  Eat 5 to 6 small meals daily instead of 3 large meals.  Avoid foods with strong smells.  Avoid drinking 30 minutes before and after meals.  Avoid fried or high-fat foods, such as butter and cream sauces.  Starchy foods are usually well-tolerated, such as cereal, toast, bread, potatoes, pasta, rice, and pretzels.  Eat crackers before you get out of bed in the morning.  Avoid spicy foods.  Ginger may help with nausea. Add  tsp ginger to hot tea or choose ginger tea.  Continue to take your prenatal vitamins as directed by your caregiver. SAMPLE MEAL PLAN Breakfast    cup oatmeal  1 slice toast  1 tsp heart-healthy margarine  1 tsp jelly  1 scrambled egg Midmorning Snack   1 cup low-fat yogurt Lunch   Plain ham sandwich  Carrot or celery sticks  1 small apple  3 graham crackers Midafternoon Snack   Cheese and crackers Dinner  4 oz pork tenderloin  1 small baked potato  1 tsp margarine   cup broccoli   cup grapes Evening Snack  1 cup pudding Document Released: 03/09/2007 Document Revised: 08/04/2011 Document Reviewed: 10/12/2012 ExitCare Patient Information 2014 [place], [place]. Trichomoniasis Trichomoniasis is an infection, caused by the Trichomonas organism, that affects both women and men. In women, the outer female genitalia and the vagina are affected. In men, the penis is mainly affected, but the prostate and other reproductive organs can also be involved. Trichomoniasis is a sexually transmitted disease (STD) and is most  often passed to another person through sexual contact. The majority of people who get trichomoniasis do so from a sexual encounter and are also at risk for other STDs. CAUSES   Sexual intercourse with an infected partner.  It can be present in swimming pools or hot tubs. SYMPTOMS   Abnormal gray-green frothy vaginal discharge in women.  Vaginal itching and irritation in women.  Itching and irritation of the area outside the vagina in women.  Penile discharge with or without pain in males.  Inflammation of the urethra (urethritis), causing painful urination.  Bleeding after sexual intercourse. RELATED COMPLICATIONS  Pelvic inflammatory disease.  Infection of the uterus (endometritis).  Infertility.  Tubal (ectopic) pregnancy.  It can be associated with other STDs, including gonorrhea and chlamydia, hepatitis B, and HIV. COMPLICATIONS DURING PREGNANCY  Early (premature) delivery.  Premature rupture of the membranes (PROM).  Low birth weight. DIAGNOSIS   Visualization of Trichomonas under the microscope from the vagina discharge.  Ph of the vagina greater than 4.5, tested with a test tape.  Trich Rapid Test.  Culture of the organism, but this is not usually needed.  It may be found on a Pap test.  Having a "strawberry cervix,"which means the cervix looks very red like a strawberry. TREATMENT   You may be given medication to fight the infection. Inform your caregiver if you could be or are pregnant. Some medications used to treat the infection should not be taken during pregnancy.  Over-the-counter medications or creams to decrease itching or irritation may be recommended.  Your sexual partner will need to  be treated if infected. HOME CARE INSTRUCTIONS   Take all medication prescribed by your caregiver.  Take over-the-counter medication for itching or irritation as directed by your caregiver.  Do not have sexual intercourse while you have the  infection.  Do not douche or wear tampons.  Discuss your infection with your partner, as your partner may have acquired the infection from you. Or, your partner may have been the person who transmitted the infection to you.  Have your sex partner examined and treated if necessary.  Practice safe, informed, and protected sex.  See your caregiver for other STD testing. SEEK MEDICAL CARE IF:   You still have symptoms after you finish the medication.  You have an oral temperature above 102 F (38.9 C).  You develop belly (abdominal) pain.  You have pain when you urinate.  You have bleeding after sexual intercourse.  You develop a rash.  The medication makes you sick or makes you throw up (vomit). Document Released: 11/05/2000 Document Revised: 08/04/2011 Document Reviewed: 12/01/2008 [facility] Patient Information 2014 [place], [place]

## 2013-09-04 NOTE — MAU Provider Note (Signed)
Attestation of Attending Supervision of Advanced Practitioner (PA/CNM/NP): Evaluation and management procedures were performed by the Advanced Practitioner under my supervision and collaboration.  I have reviewed the Advanced Practitioner's note and chart, and I agree with the management and plan.  Matelyn Antonelli, MD, FACOG Attending Obstetrician & Gynecologist Faculty Practice, [facility]  

## 2013-09-04 NOTE — MAU Note (Signed)
Pt needed to leave after taking metranidazole due to ride waiting for her. Ater crackers before med and more crackers given to eat on way home.

## 2013-09-05 LAB — GC/CHLAMYDIA PROBE AMP
CT PROBE, AMP APTIMA: NEGATIVE
GC PROBE AMP APTIMA: NEGATIVE

## 2013-10-27 LAB — OB RESULTS CONSOLE HIV ANTIBODY (ROUTINE TESTING): HIV: NONREACTIVE

## 2013-10-27 LAB — OB RESULTS CONSOLE GC/CHLAMYDIA
CHLAMYDIA, DNA PROBE: NEGATIVE
Gonorrhea: NEGATIVE

## 2013-10-27 LAB — OB RESULTS CONSOLE RPR: RPR: NONREACTIVE

## 2013-10-27 LAB — OB RESULTS CONSOLE ABO/RH: RH TYPE: POSITIVE

## 2013-10-27 LAB — OB RESULTS CONSOLE HEPATITIS B SURFACE ANTIGEN: Hepatitis B Surface Ag: NEGATIVE

## 2013-10-27 LAB — OB RESULTS CONSOLE RUBELLA ANTIBODY, IGM: Rubella: IMMUNE

## 2013-10-27 LAB — OB RESULTS CONSOLE ANTIBODY SCREEN: ANTIBODY SCREEN: NEGATIVE

## 2013-12-29 ENCOUNTER — Encounter (HOSPITAL_COMMUNITY): Payer: Self-pay | Admitting: *Deleted

## 2013-12-29 ENCOUNTER — Inpatient Hospital Stay (HOSPITAL_COMMUNITY)
Admission: AD | Admit: 2013-12-29 | Discharge: 2013-12-30 | Disposition: A | Discharge status: Home / Self Care | Payer: Medicaid Other | Source: Ambulatory Visit | Attending: Obstetrics and Gynecology | Admitting: Obstetrics and Gynecology

## 2013-12-29 DIAGNOSIS — O30009 Twin pregnancy, unspecified number of placenta and unspecified number of amniotic sacs, unspecified trimester: Secondary | ICD-10-CM | POA: Diagnosis not present

## 2013-12-29 DIAGNOSIS — O2342 Unspecified infection of urinary tract in pregnancy, second trimester: Secondary | ICD-10-CM

## 2013-12-29 DIAGNOSIS — O2390 Unspecified genitourinary tract infection in pregnancy, unspecified trimester: Secondary | ICD-10-CM | POA: Diagnosis not present

## 2013-12-29 DIAGNOSIS — R109 Unspecified abdominal pain: Secondary | ICD-10-CM | POA: Diagnosis present

## 2013-12-29 DIAGNOSIS — N390 Urinary tract infection, site not specified: Secondary | ICD-10-CM

## 2013-12-29 LAB — URINALYSIS, ROUTINE W REFLEX MICROSCOPIC
Bilirubin Urine: NEGATIVE
Glucose, UA: NEGATIVE mg/dL
KETONES UR: NEGATIVE mg/dL
LEUKOCYTES UA: NEGATIVE
NITRITE: NEGATIVE
PH: 6.5 (ref 5.0–8.0)
Protein, ur: 30 mg/dL — AB
Specific Gravity, Urine: 1.015 (ref 1.005–1.030)
UROBILINOGEN UA: 1 mg/dL (ref 0.0–1.0)

## 2013-12-29 LAB — WET PREP, GENITAL
CLUE CELLS WET PREP: NONE SEEN
Trich, Wet Prep: NONE SEEN
Yeast Wet Prep HPF POC: NONE SEEN

## 2013-12-29 LAB — URINE MICROSCOPIC-ADD ON

## 2013-12-29 MED ORDER — NITROFURANTOIN MONOHYD MACRO 100 MG PO CAPS: 100.0000 mg | ORAL_CAPSULE | Freq: Two times a day (BID) | ORAL | Status: DC

## 2013-12-29 MED ORDER — NITROFURANTOIN MONOHYD MACRO 100 MG PO CAPS
100.0000 mg | ORAL_CAPSULE | Freq: Once | ORAL | Status: DC
  Filled 2013-12-29: qty 1

## 2013-12-29 NOTE — MAU Note (Signed)
Pt presented with some lower abdominal pain that began last evening but has become constant today. Pt denies leaking of fluid and discharge. Pt is having twins and they have been active.

## 2013-12-29 NOTE — Discharge Instructions (Signed)

## 2013-12-29 NOTE — MAU Provider Note (Signed)
History     CSN: [id number]  Arrival date and time: 12/29/13 2114   First Provider Initiated Contact with Patient 12/29/13 2216      Chief Complaint  Patient presents with  . Abdominal Pain   Abdominal Pain   Tracey Hill is a 23 y.o. G3P1011 at 3226w4d with twins who presents today with lower abdominal and pelvic bone pain. She states that she has had the pain for about 2 days, but it seems worse today. She has not taken anything for the pain. She states that this is a twin pregnancy, and the babies have been active. She denies any vaginal bleeding or LOF. She denies any recent intercourse.   Past Medical History  Diagnosis Date  . Pelvic inflammatory disease (PID)   . Medical history non-contributory     Past Surgical History  Procedure Laterality Date  . No past surgeries      Family History  Problem Relation Age of Onset  . Hypertension Father     History  Substance Use Topics  . Smoking status: Never Smoker   . Smokeless tobacco: Never Used  . Alcohol Use: No    Allergies: No Known Allergies  Prescriptions prior to admission  Medication Sig Dispense Refill  . Prenatal Vit-Fe Fumarate-FA (PRENATAL PO) Take 1 tablet by mouth daily.        Review of Systems  Gastrointestinal: Positive for abdominal pain.   Physical Exam   Blood pressure 109/59, pulse 93, temperature 98.4 F (36.9 C), temperature source Oral, resp. rate 18, last menstrual period 07/03/2013.  Physical Exam  Nursing note and vitals reviewed. Constitutional: She is oriented to person, place, and time. She appears well-developed and well-nourished. No distress.  Cardiovascular: Normal rate.   Respiratory: Effort normal.  GI: Soft. There is no tenderness. There is no rebound.  Genitourinary:   External: no lesion Vagina: small amount of white discharge Cervix: pink, smooth, closed/thick/-2 Uterus: AGA   Neurological: She is alert and oriented to person, place, and time.  Skin: Skin is  warm and dry.  Psychiatric: She has a normal mood and affect.   FHTA 145, moderate with accels, no decels FHTB 150, moderate with accels, no decels  TOCO none  MAU Course  Procedures Results for orders placed during the hospital encounter of 12/29/13 (from the past 24 hour(s))  URINALYSIS, ROUTINE W REFLEX MICROSCOPIC     Status: Abnormal   Collection Time    12/29/13  9:25 PM      Result Value Ref Range   Color, Urine YELLOW  YELLOW   APPearance CLEAR  CLEAR   Specific Gravity, Urine 1.015  1.005 - 1.030   pH 6.5  5.0 - 8.0   Glucose, UA NEGATIVE  NEGATIVE mg/dL   Hgb urine dipstick MODERATE (*) NEGATIVE   Bilirubin Urine NEGATIVE  NEGATIVE   Ketones, ur NEGATIVE  NEGATIVE mg/dL   Protein, ur 30 (*) NEGATIVE mg/dL   Urobilinogen, UA 1.0  0.0 - 1.0 mg/dL   Nitrite NEGATIVE  NEGATIVE   Leukocytes, UA NEGATIVE  NEGATIVE  URINE MICROSCOPIC-ADD ON     Status: Abnormal   Collection Time    12/29/13  9:25 PM      Result Value Ref Range   Squamous Epithelial / LPF FEW (*) RARE   WBC, UA 3-6  <3 WBC/hpf   RBC / HPF 11-20  <3 RBC/hpf   Bacteria, UA MANY (*) RARE   Urine-Other MUCOUS PRESENT    WET PREP,  GENITAL     Status: Abnormal   Collection Time    12/29/13 10:22 PM      Result Value Ref Range   Yeast Wet Prep HPF POC NONE SEEN  NONE SEEN   Trich, Wet Prep NONE SEEN  NONE SEEN   Clue Cells Wet Prep HPF POC NONE SEEN  NONE SEEN   WBC, Wet Prep HPF POC FEW (*) NONE SEEN   2343: D/W Dr. Ellyn Hack, will treat with Macrobid, and ok for dc home.  Assessment and Plan   1. UTI (urinary tract infection) in pregnancy in second trimester    Fetal kick counts  PTL precautions Return to MAU as needed  Follow-up Information   Follow up with [facility]. (As scheduled)    Contact information:   [address]  [address] [place] [address] [phone]        Tawnya Crook 12/29/2013, 10:28 PM

## 2013-12-29 NOTE — MAU Provider Note (Signed)
  Presents with pelvic pain, ?UTI on urine.  Culture sent, treated with Macrobid 145's mod var; 150 mod var toco none

## 2013-12-30 LAB — GC/CHLAMYDIA PROBE AMP
CT Probe RNA: NEGATIVE
GC PROBE AMP APTIMA: NEGATIVE

## 2013-12-31 LAB — URINE CULTURE
Colony Count: NO GROWTH
Culture: NO GROWTH

## 2014-01-27 ENCOUNTER — Encounter (HOSPITAL_COMMUNITY): Payer: Self-pay | Admitting: *Deleted

## 2014-01-27 ENCOUNTER — Inpatient Hospital Stay (HOSPITAL_COMMUNITY)
Admission: AD | Admit: 2014-01-27 | Discharge: 2014-01-27 | Disposition: A | Discharge status: Home / Self Care | Payer: 59 | Source: Ambulatory Visit | Attending: Obstetrics and Gynecology | Admitting: Obstetrics and Gynecology

## 2014-01-27 DIAGNOSIS — O30009 Twin pregnancy, unspecified number of placenta and unspecified number of amniotic sacs, unspecified trimester: Secondary | ICD-10-CM | POA: Insufficient documentation

## 2014-01-27 DIAGNOSIS — O4700 False labor before 37 completed weeks of gestation, unspecified trimester: Secondary | ICD-10-CM | POA: Diagnosis not present

## 2014-01-27 DIAGNOSIS — O4702 False labor before 37 completed weeks of gestation, second trimester: Secondary | ICD-10-CM

## 2014-01-27 DIAGNOSIS — O368192 Decreased fetal movements, unspecified trimester, fetus 2: Secondary | ICD-10-CM

## 2014-01-27 DIAGNOSIS — O368190 Decreased fetal movements, unspecified trimester, not applicable or unspecified: Secondary | ICD-10-CM | POA: Diagnosis present

## 2014-01-27 HISTORY — DX: Unspecified infectious disease: B99.9

## 2014-01-27 HISTORY — DX: Cardiac murmur, unspecified: R01.1

## 2014-01-27 LAB — WET PREP, GENITAL
CLUE CELLS WET PREP: NONE SEEN
Trich, Wet Prep: NONE SEEN
YEAST WET PREP: NONE SEEN

## 2014-01-27 LAB — URINALYSIS, ROUTINE W REFLEX MICROSCOPIC
Bilirubin Urine: NEGATIVE
GLUCOSE, UA: NEGATIVE mg/dL
Hgb urine dipstick: NEGATIVE
Ketones, ur: NEGATIVE mg/dL
LEUKOCYTES UA: NEGATIVE
NITRITE: NEGATIVE
PH: 7 (ref 5.0–8.0)
Protein, ur: NEGATIVE mg/dL
Specific Gravity, Urine: 1.01 (ref 1.005–1.030)
Urobilinogen, UA: 2 mg/dL — ABNORMAL HIGH (ref 0.0–1.0)

## 2014-01-27 NOTE — MAU Note (Signed)
Decreased fetal movement today.  Twin gest. No pain, bleeding or leaking.

## 2014-01-27 NOTE — Discharge Instructions (Signed)
Braxton Hicks Contractions °Contractions of the uterus can occur throughout pregnancy. Contractions are not always a sign that you are in labor.  °WHAT ARE BRAXTON HICKS CONTRACTIONS?  °Contractions that occur before labor are called Braxton Hicks contractions, or false labor. Toward the end of pregnancy (32-34 weeks), these contractions can develop more often and may become more forceful. This is not true labor because these contractions do not result in opening (dilatation) and thinning of the cervix. They are sometimes difficult to tell apart from true labor because these contractions can be forceful and people have different pain tolerances. You should not feel embarrassed if you go to the hospital with false labor. Sometimes, the only way to tell if you are in true labor is for your health care provider to look for changes in the cervix. °If there are no prenatal problems or other health problems associated with the pregnancy, it is completely safe to be sent home with false labor and await the onset of true labor. °HOW CAN YOU TELL THE DIFFERENCE BETWEEN TRUE AND FALSE LABOR? °False Labor °· The contractions of false labor are usually shorter and not as hard as those of true labor.   °· The contractions are usually irregular.   °· The contractions are often felt in the front of the lower abdomen and in the groin.   °· The contractions may go away when you walk around or change positions while lying down.   °· The contractions get weaker and are shorter lasting as time goes on.   °· The contractions do not usually become progressively stronger, regular, and closer together as with true labor.   °True Labor °· Contractions in true labor last 30-70 seconds, become very regular, usually become more intense, and increase in frequency.   °· The contractions do not go away with walking.   °· The discomfort is usually felt in the top of the uterus and spreads to the lower abdomen and low back.   °· True labor can be  determined by your health care provider with an exam. This will show that the cervix is dilating and getting thinner.   °WHAT TO REMEMBER °· Keep up with your usual exercises and follow other instructions given by your health care provider.   °· Take medicines as directed by your health care provider.   °· Keep your regular prenatal appointments.   °· Eat and drink lightly if you think you are going into labor.   °· If Braxton Hicks contractions are making you uncomfortable:   °¨ Change your position from lying down or resting to walking, or from walking to resting.   °¨ Sit and rest in a tub of warm water.   °¨ Drink 2-3 glasses of water. Dehydration may cause these contractions.   °¨ Do slow and deep breathing several times an hour.   °WHEN SHOULD I SEEK IMMEDIATE MEDICAL CARE? °Seek immediate medical care if: °· Your contractions become stronger, more regular, and closer together.   °· You have fluid leaking or gushing from your vagina.   °· You have a fever.   °· You pass blood-tinged mucus.   °· You have vaginal bleeding.   °· You have continuous abdominal pain.   °· You have low back pain that you never had before.   °· You feel your baby's head pushing down and causing pelvic pressure.   °· Your baby is not moving as much as it used to.   °Document Released: 05/12/2005 Document Revised: 05/17/2013 Document Reviewed: 02/21/2013 °ExitCare® Patient Information ©2015 [employer]. This information is not intended to replace advice given to you by your health care   provider. Make sure you discuss any questions you have with your health care provider. ° °

## 2014-01-27 NOTE — MAU Provider Note (Signed)
History     CSN: [id number]  Arrival date and time: 01/27/14 1650   None     No chief complaint on file.  HPI  [name] is a 23 y.o. female G3P1011 at [redacted]w[redacted]d; twin gestation. The patient presents with decreased fetal movement. She denies bleeding or leaking of fluid, since here in MAU the patient feels good fetal movements.   OB History   Grav Para Term Preterm Abortions TAB SAB Ect Mult Living   Past Medical History  Diagnosis Date  . Pelvic inflammatory disease (PID)   . Heart murmur     ? first trimester  . Infection     UTI    Past Surgical History  Procedure Laterality Date  . No past surgeries      Family History  Problem Relation Age of Onset  . Hypertension Father   . Diabetes Maternal Grandfather   . Hearing loss Neg Hx     History  Substance Use Topics  . Smoking status: Never Smoker   . Smokeless tobacco: Never Used  . Alcohol Use: No    Allergies: No Known Allergies  Prescriptions prior to admission  Medication Sig Dispense Refill  . nitrofurantoin, macrocrystal-monohydrate, (MACROBID) 100 MG capsule Take 1 capsule (100 mg total) by mouth 2 (two) times daily.  13 capsule  0  . Prenatal Vit-Fe Fumarate-FA (PRENATAL PO) Take 1 tablet by mouth daily.       Results for orders placed during the hospital encounter of 01/27/14 (from the past 48 hour(s))  WET PREP, GENITAL     Status: Abnormal   Collection Time    01/27/14  6:04 PM      Result Value Ref Range   Yeast Wet Prep HPF POC NONE SEEN  NONE SEEN   Trich, Wet Prep NONE SEEN  NONE SEEN   Clue Cells Wet Prep HPF POC NONE SEEN  NONE SEEN   WBC, Wet Prep HPF POC FEW (*) NONE SEEN   Comment: MANY BACTERIA SEEN  URINALYSIS, ROUTINE W REFLEX MICROSCOPIC     Status: Abnormal   Collection Time    01/27/14  6:14 PM      Result Value Ref Range   Color, Urine YELLOW  YELLOW   APPearance CLEAR  CLEAR   Specific Gravity, Urine 1.010  1.005 - 1.030   pH 7.0  5.0 - 8.0    Glucose, UA NEGATIVE  NEGATIVE mg/dL   Hgb urine dipstick NEGATIVE  NEGATIVE   Bilirubin Urine NEGATIVE  NEGATIVE   Ketones, ur NEGATIVE  NEGATIVE mg/dL   Protein, ur NEGATIVE  NEGATIVE mg/dL   Urobilinogen, UA 2.0 (*) 0.0 - 1.0 mg/dL   Nitrite NEGATIVE  NEGATIVE   Leukocytes, UA NEGATIVE  NEGATIVE   Comment: MICROSCOPIC NOT DONE ON URINES WITH NEGATIVE PROTEIN, BLOOD, LEUKOCYTES, NITRITE, OR GLUCOSE <1000 mg/dL.    Review of Systems  Constitutional: Negative for fever and chills.  Gastrointestinal: Negative for nausea, vomiting and abdominal pain.  Musculoskeletal: Positive for back pain (Patient is always uncomfortable. ).   Physical Exam   Blood pressure 116/59, pulse 89, temperature 98.9 F (37.2 C), temperature source Oral, resp. rate 18, last menstrual period 07/03/2013.  Physical Exam  Constitutional: She is oriented to person, place, and time. She appears well-developed and well-nourished. No distress.  HENT:  Head: Normocephalic.  Eyes: Pupils are equal, round, and reactive to light.  Neck: Neck supple.  Respiratory: Effort normal.  GI: Soft. She exhibits no distension. There is no tenderness.  Genitourinary:  Speculum exam: Vagina - Moderate amount of creamy discharge, no odor Cervix - No contact bleeding Wet prep and GC/Chlamydia collected  Chaperone present for exam.   Musculoskeletal: Normal range of motion.  Neurological: She is alert and oriented to person, place, and time.  Skin: Skin is warm. She is not diaphoretic.  Psychiatric: Her behavior is normal.     Fetal Tracing Fetus A: Baseline: 135 bpm  Variability: Moderate  Accelerations: 15x15 Decelerations: None Toco: Q3-4 mins apart. Pt denies, does not feel contractions    Fetal Tracing Fetus B: Baseline: 130 bpm Variability: Moderate  Accelerations: 15x15 Decelerations: None Toco:  Dilation: Closed Effacement (%): Thick Exam by:: Zea Kostka NP   MAU Course  Procedures None  MDM UA   Discussed the patient with Dr. Ambrose Mantle. The patients contractions are not painful and her cervix is closed. Ok to discharge the patient home.  FFN collected; not sent.  Assessment and Plan   A: 1. Preterm contractions, second trimester   2. Decreased fetal movement, unspecified trimester, fetus 2    P: Discharge home in stable condition Pelvic rest Increase fluids Preterm labor precautions discussed at length Kick counts Return to MAU as needed, if symptoms worsen  Iona Hansen [name], NP 01/27/2014, 5:39 PM

## 2014-01-28 LAB — GC/CHLAMYDIA PROBE AMP
CT Probe RNA: NEGATIVE
GC Probe RNA: NEGATIVE

## 2014-03-14 ENCOUNTER — Inpatient Hospital Stay (HOSPITAL_COMMUNITY): Payer: 59 | Admitting: Anesthesiology

## 2014-03-14 ENCOUNTER — Other Ambulatory Visit (HOSPITAL_COMMUNITY): Payer: Self-pay | Admitting: Obstetrics and Gynecology

## 2014-03-14 ENCOUNTER — Encounter (HOSPITAL_COMMUNITY): Payer: 59 | Admitting: Anesthesiology

## 2014-03-14 ENCOUNTER — Ambulatory Visit (HOSPITAL_COMMUNITY)
Admission: RE | Admit: 2014-03-14 | Discharge: 2014-03-14 | Disposition: A | Discharge status: Home / Self Care | Payer: 59 | Source: Ambulatory Visit | Attending: Obstetrics and Gynecology | Admitting: Obstetrics and Gynecology

## 2014-03-14 ENCOUNTER — Encounter (HOSPITAL_COMMUNITY): Payer: Self-pay | Admitting: Anesthesiology

## 2014-03-14 ENCOUNTER — Inpatient Hospital Stay (HOSPITAL_COMMUNITY)
Admission: AD | Admit: 2014-03-14 | Discharge: 2014-03-17 | DRG: 775 | Disposition: A | Discharge status: Home / Self Care | Payer: 59 | Source: Ambulatory Visit | Attending: Obstetrics and Gynecology | Admitting: Obstetrics and Gynecology

## 2014-03-14 ENCOUNTER — Encounter (HOSPITAL_COMMUNITY): Payer: Self-pay | Admitting: *Deleted

## 2014-03-14 DIAGNOSIS — O30043 Twin pregnancy, dichorionic/diamniotic, third trimester: Secondary | ICD-10-CM

## 2014-03-14 DIAGNOSIS — O30049 Twin pregnancy, dichorionic/diamniotic, unspecified trimester: Secondary | ICD-10-CM | POA: Diagnosis present

## 2014-03-14 DIAGNOSIS — Z3A36 36 weeks gestation of pregnancy: Secondary | ICD-10-CM | POA: Diagnosis present

## 2014-03-14 DIAGNOSIS — O368132 Decreased fetal movements, third trimester, fetus 2: Secondary | ICD-10-CM

## 2014-03-14 DIAGNOSIS — O288 Other abnormal findings on antenatal screening of mother: Secondary | ICD-10-CM

## 2014-03-14 DIAGNOSIS — Z3490 Encounter for supervision of normal pregnancy, unspecified, unspecified trimester: Secondary | ICD-10-CM

## 2014-03-14 LAB — TYPE AND SCREEN
ABO/RH(D): B POS
ANTIBODY SCREEN: NEGATIVE

## 2014-03-14 LAB — ABO/RH: ABO/RH(D): B POS

## 2014-03-14 LAB — CBC
HEMATOCRIT: 34.1 % — AB (ref 36.0–46.0)
Hemoglobin: 11.5 g/dL — ABNORMAL LOW (ref 12.0–15.0)
MCH: 28.3 pg (ref 26.0–34.0)
MCHC: 33.7 g/dL (ref 30.0–36.0)
MCV: 84 fL (ref 78.0–100.0)
PLATELETS: 183 10*3/uL (ref 150–400)
RBC: 4.06 MIL/uL (ref 3.87–5.11)
RDW: 14.4 % (ref 11.5–15.5)
WBC: 5.9 10*3/uL (ref 4.0–10.5)

## 2014-03-14 MED ORDER — PHENYLEPHRINE 40 MCG/ML (10ML) SYRINGE FOR IV PUSH (FOR BLOOD PRESSURE SUPPORT)
80.0000 ug | PREFILLED_SYRINGE | INTRAVENOUS | Status: DC | PRN
  Filled 2014-03-14: qty 2

## 2014-03-14 MED ORDER — FENTANYL 2.5 MCG/ML BUPIVACAINE 1/10 % EPIDURAL INFUSION (WH - ANES)
14.0000 mL/h | INTRAMUSCULAR | Status: DC | PRN
  Administered 2014-03-14: 14 mL/h via EPIDURAL
  Filled 2014-03-14: qty 125

## 2014-03-14 MED ORDER — ONDANSETRON HCL 4 MG/2ML IJ SOLN: 4.0000 mg | Freq: Four times a day (QID) | INTRAMUSCULAR | Status: DC | PRN

## 2014-03-14 MED ORDER — OXYTOCIN 40 UNITS IN LACTATED RINGERS INFUSION - SIMPLE MED
62.5000 mL/h | INTRAVENOUS | Status: DC
  Filled 2014-03-14: qty 1000

## 2014-03-14 MED ORDER — CITRIC ACID-SODIUM CITRATE 334-500 MG/5ML PO SOLN: 30.0000 mL | ORAL | Status: DC | PRN

## 2014-03-14 MED ORDER — DIPHENHYDRAMINE HCL 50 MG/ML IJ SOLN: 12.5000 mg | INTRAMUSCULAR | Status: DC | PRN

## 2014-03-14 MED ORDER — ACETAMINOPHEN 325 MG PO TABS
650.0000 mg | ORAL_TABLET | ORAL | Status: DC | PRN
Start: 2014-03-14 — End: 2014-03-15

## 2014-03-14 MED ORDER — EPHEDRINE 5 MG/ML INJ
10.0000 mg | INTRAVENOUS | Status: DC | PRN
  Filled 2014-03-14: qty 2

## 2014-03-14 MED ORDER — LACTATED RINGERS IV SOLN
500.0000 mL | INTRAVENOUS | Status: DC | PRN
  Administered 2014-03-14: 1000 mL via INTRAVENOUS

## 2014-03-14 MED ORDER — OXYTOCIN BOLUS FROM INFUSION
500.0000 mL | INTRAVENOUS | Status: DC
  Administered 2014-03-15: 500 mL via INTRAVENOUS

## 2014-03-14 MED ORDER — LACTATED RINGERS IV SOLN: 500.0000 mL | Freq: Once | INTRAVENOUS | Status: DC

## 2014-03-14 MED ORDER — OXYCODONE-ACETAMINOPHEN 5-325 MG PO TABS: 1.0000 | ORAL_TABLET | ORAL | Status: DC | PRN

## 2014-03-14 MED ORDER — LACTATED RINGERS IV SOLN
INTRAVENOUS | Status: DC
  Administered 2014-03-14 (×3): via INTRAVENOUS

## 2014-03-14 MED ORDER — OXYCODONE-ACETAMINOPHEN 5-325 MG PO TABS: 2.0000 | ORAL_TABLET | ORAL | Status: DC | PRN

## 2014-03-14 MED ORDER — LIDOCAINE HCL (PF) 1 % IJ SOLN
INTRAMUSCULAR | Status: DC | PRN
  Administered 2014-03-14 (×3): 4 mL

## 2014-03-14 MED ORDER — LIDOCAINE HCL (PF) 1 % IJ SOLN
30.0000 mL | INTRAMUSCULAR | Status: DC | PRN
  Filled 2014-03-14: qty 30

## 2014-03-14 MED ORDER — FLEET ENEMA 7-19 GM/118ML RE ENEM: 1.0000 | ENEMA | RECTAL | Status: DC | PRN

## 2014-03-14 MED ORDER — PHENYLEPHRINE 40 MCG/ML (10ML) SYRINGE FOR IV PUSH (FOR BLOOD PRESSURE SUPPORT)
80.0000 ug | PREFILLED_SYRINGE | INTRAVENOUS | Status: DC | PRN
  Filled 2014-03-14: qty 2
  Filled 2014-03-14: qty 10

## 2014-03-14 MED ORDER — TERBUTALINE SULFATE 1 MG/ML IJ SOLN: 0.2500 mg | Freq: Once | INTRAMUSCULAR | Status: AC | PRN

## 2014-03-14 MED ORDER — OXYTOCIN 40 UNITS IN LACTATED RINGERS INFUSION - SIMPLE MED
1.0000 m[IU]/min | INTRAVENOUS | Status: DC
  Administered 2014-03-14: 2 m[IU]/min via INTRAVENOUS
  Administered 2014-03-14: 26 m[IU]/min via INTRAVENOUS

## 2014-03-14 NOTE — Anesthesia Preprocedure Evaluation (Addendum)
Anesthesia Evaluation  Patient identified by MRN, date of birth, ID band Patient awake    Reviewed: Allergy & Precautions, H&P , NPO status , Patient's Chart, lab work & pertinent test results, reviewed documented beta blocker date and time   History of Anesthesia Complications Negative for: history of anesthetic complications  Airway Mallampati: II TM Distance: >3 FB Neck ROM: full    Dental  (+) Teeth Intact   Pulmonary neg pulmonary ROS,  breath sounds clear to auscultation        Cardiovascular negative cardio ROS  Rhythm:regular Rate:Normal     Neuro/Psych negative neurological ROS  negative psych ROS   GI/Hepatic negative GI ROS, Neg liver ROS,   Endo/Other  negative endocrine ROS  Renal/GU negative Renal ROS     Musculoskeletal   Abdominal   Peds  Hematology negative hematology ROS (+)   Anesthesia Other Findings   Reproductive/Obstetrics (+) Pregnancy (twins - vtx/vtx)                          Anesthesia Physical Anesthesia Plan  ASA: II  Anesthesia Plan: Epidural   Post-op Pain Management:    Induction:   Airway Management Planned:   Additional Equipment:   Intra-op Plan:   Post-operative Plan:   Informed Consent: I have reviewed the patients History and Physical, chart, labs and discussed the procedure including the risks, benefits and alternatives for the proposed anesthesia with the patient or authorized representative who has indicated his/her understanding and acceptance.     Plan Discussed with:   Anesthesia Plan Comments:         Anesthesia Quick Evaluation

## 2014-03-14 NOTE — H&P (Signed)
NAMGwinda Hill:  Tracey Hill, Tracey Hill                 ACCOUNT NO.:  [id number]  MEDICAL RECORD NO.:  [id number]  LOCATION:  9171                          FACILITY:  [facility]  PHYSICIAN:  Malachi Prohomas F. Ambrose MantleHenley, M.D. DATE OF BIRTH:  1991/03/19  DATE OF ADMISSION:  03/14/2014 DATE OF DISCHARGE:                             HISTORY & PHYSICAL   HISTORY OF PRESENT ILLNESS:  This is a 23 year old black female, para 1- 0-1-1, [facility]ida [facility] who is admitted for induction of labor because of twin pregnancy that had nonreactive nonstress test and on biophysical profile, score of 4/10 for baby B.  Dr. Lurlean HornsNitsche, [facility] and the perinatal specialist has advised inducing the labor.  Blood group and type B positive, negative antibody, urine culture negative.  Hepatitis B surface antigen negative, HIV negative, GC and Chlamydia negative. Varicella immune.  Rubella immune.  Hemoglobin AA.  Quad screen negative.  One hour Glucola 106.  Repeat HIV and RPR negative.  GC and Chlamydia negative.  Group B strep negative.  The patient began her prenatal course with our practice at 16 weeks.  She did have an ultrasound on September 01, 2013, showed her to be at 9 weeks and 0 days. Her [facility] ED[facility] was April 09, 2014, [facility]  She was followed throughout the pregnancy with ultrasounds and they all showed concordant growth.  The babies were vertex, and on the day of admission, her nonstress test was nonreactive, biophysical profile showed 4/10 on baby B and Dr. Otho PerlNitsche recommended induction of labor.  PAST MEDICAL HISTORY:  No significant illnesses.  PAST SURGICAL HISTORY:  None reported.  ALLERGIES:  Macrobid caused a rash.  No latex or food allergies.  SOCIAL HISTORY:  Never smoked.  Drank before pregnancy.  None with the pregnancy.  Denies illicit drugs.  Claims to have a 12th grade education.  FAMILY HISTORY:  Father with high blood  pressure.  Maternal grandfather with diabetes.  PHYSICAL EXAMINATION:  VITAL SIGNS:  On admission, blood pressure in our office was 108/66, pulse was 80.  Weight was 185 pounds which represented a 31 pounds weight gain since 16 weeks pregnancy. HEART:  Normal size and sounds.  There is a 2/6 ejection murmur. LUNGS:  Clear to auscultation.  Fundal height 46 cm.  Fetal heart tones were normal, but nonstress test was nonreactive.  Cervix 2 cm, 20% vertex at a -3.  ADMITTING IMPRESSION:  Intrauterine pregnancy at 36 weeks and 4 days, twin pregnancy, dichorionic, diamniotic.  Poor biophysical profile on baby B.  The patient is admitted for induction of labor at 36 weeks and 4 days on the advice of the perinatologist.  She understands the risks involved and agrees to proceed.     Malachi Prohomas F. Ambrose MantleHenley, M.D.     TFH/MEDQ  D:  03/14/2014  T:  03/14/2014  Job:  [id number]

## 2014-03-14 NOTE — Progress Notes (Signed)
See H&P by Dr. Ambrose MantleHenley 36+ week, di/di twins, for induction due to NR NST on Baby B, BPP 4/8 on Baby B, 6/8 on Baby A Feeling some ctx Afeb, VSS FHT- Cat I x 2, irreg ctx On pitocin, will AROM at lower station, monitor progress, anticipate SVD

## 2014-03-14 NOTE — Anesthesia Procedure Notes (Signed)
Epidural Patient location during procedure: OB Start time: 03/14/2014 7:50 PM  Staffing Anesthesiologist: Lillah Standre Performed by: anesthesiologist   Preanesthetic Checklist Completed: patient identified, site marked, surgical consent, pre-op evaluation, timeout performed, IV checked, risks and benefits discussed and monitors and equipment checked  Epidural Patient position: sitting Prep: site prepped and draped and DuraPrep Patient monitoring: continuous pulse ox and blood pressure Approach: midline Location: L3-L4 Injection technique: LOR air  Needle:  Needle type: Tuohy  Needle gauge: 17 G Needle length: 9 cm and 9 Needle insertion depth: 6 cm Catheter type: closed end flexible Catheter size: 19 Gauge Catheter at skin depth: 11 cm Test dose: negative  Assessment Events: blood not aspirated, injection not painful, no injection resistance, negative IV test and no paresthesia  Additional Notes Discussed risk of headache, infection, bleeding, nerve injury and failed or incomplete block.  Patient voices understanding and wishes to proceed.  Epidural placed easily on first attempt.  No paresthesia.  Patient tolerated procedure well with no apparent complications.  Jasmine DecemberA. Jalaysia Lobb, MDReason for block:procedure for pain

## 2014-03-15 ENCOUNTER — Encounter (HOSPITAL_COMMUNITY): Payer: Self-pay | Admitting: *Deleted

## 2014-03-15 DIAGNOSIS — O30049 Twin pregnancy, dichorionic/diamniotic, unspecified trimester: Secondary | ICD-10-CM | POA: Diagnosis present

## 2014-03-15 DIAGNOSIS — Z3A37 37 weeks gestation of pregnancy: Secondary | ICD-10-CM

## 2014-03-15 LAB — RPR

## 2014-03-15 LAB — CBC
HEMATOCRIT: 29.1 % — AB (ref 36.0–46.0)
Hemoglobin: 9.8 g/dL — ABNORMAL LOW (ref 12.0–15.0)
MCH: 28.2 pg (ref 26.0–34.0)
MCHC: 33.7 g/dL (ref 30.0–36.0)
MCV: 83.6 fL (ref 78.0–100.0)
PLATELETS: 162 10*3/uL (ref 150–400)
RBC: 3.48 MIL/uL — ABNORMAL LOW (ref 3.87–5.11)
RDW: 14.2 % (ref 11.5–15.5)
WBC: 7.4 10*3/uL (ref 4.0–10.5)

## 2014-03-15 MED ORDER — MAGNESIUM HYDROXIDE 400 MG/5ML PO SUSP: 30.0000 mL | ORAL | Status: DC | PRN

## 2014-03-15 MED ORDER — SIMETHICONE 80 MG PO CHEW: 80.0000 mg | CHEWABLE_TABLET | ORAL | Status: DC | PRN

## 2014-03-15 MED ORDER — PRENATAL MULTIVITAMIN CH
1.0000 | ORAL_TABLET | Freq: Every day | ORAL | Status: DC
  Administered 2014-03-15 – 2014-03-17 (×3): 1 via ORAL
  Filled 2014-03-15 (×3): qty 1

## 2014-03-15 MED ORDER — IBUPROFEN 600 MG PO TABS
600.0000 mg | ORAL_TABLET | Freq: Four times a day (QID) | ORAL | Status: DC
  Administered 2014-03-15 – 2014-03-17 (×10): 600 mg via ORAL
  Filled 2014-03-15 (×10): qty 1

## 2014-03-15 MED ORDER — ONDANSETRON HCL 4 MG PO TABS: 4.0000 mg | ORAL_TABLET | ORAL | Status: DC | PRN

## 2014-03-15 MED ORDER — SENNOSIDES-DOCUSATE SODIUM 8.6-50 MG PO TABS
2.0000 | ORAL_TABLET | ORAL | Status: DC
  Administered 2014-03-15 – 2014-03-16 (×2): 2 via ORAL
  Filled 2014-03-15 (×2): qty 2

## 2014-03-15 MED ORDER — WITCH HAZEL-GLYCERIN EX PADS: 1.0000 "application " | MEDICATED_PAD | CUTANEOUS | Status: DC | PRN

## 2014-03-15 MED ORDER — OXYCODONE-ACETAMINOPHEN 5-325 MG PO TABS: 1.0000 | ORAL_TABLET | ORAL | Status: DC | PRN

## 2014-03-15 MED ORDER — METHYLERGONOVINE MALEATE 0.2 MG PO TABS: 0.2000 mg | ORAL_TABLET | ORAL | Status: DC | PRN

## 2014-03-15 MED ORDER — LANOLIN HYDROUS EX OINT: TOPICAL_OINTMENT | CUTANEOUS | Status: DC | PRN

## 2014-03-15 MED ORDER — DIPHENHYDRAMINE HCL 25 MG PO CAPS: 25.0000 mg | ORAL_CAPSULE | Freq: Four times a day (QID) | ORAL | Status: DC | PRN

## 2014-03-15 MED ORDER — OXYCODONE-ACETAMINOPHEN 5-325 MG PO TABS: 2.0000 | ORAL_TABLET | ORAL | Status: DC | PRN

## 2014-03-15 MED ORDER — BENZOCAINE-MENTHOL 20-0.5 % EX AERO
1.0000 "application " | INHALATION_SPRAY | CUTANEOUS | Status: DC | PRN
  Administered 2014-03-15: 1 via TOPICAL
  Filled 2014-03-15: qty 56

## 2014-03-15 MED ORDER — ONDANSETRON HCL 4 MG/2ML IJ SOLN: 4.0000 mg | INTRAMUSCULAR | Status: DC | PRN

## 2014-03-15 MED ORDER — METHYLERGONOVINE MALEATE 0.2 MG/ML IJ SOLN: 0.2000 mg | INTRAMUSCULAR | Status: DC | PRN

## 2014-03-15 MED ORDER — MEASLES, MUMPS & RUBELLA VAC ~~LOC~~ INJ
0.5000 mL | INJECTION | Freq: Once | SUBCUTANEOUS | Status: DC
  Filled 2014-03-15: qty 0.5

## 2014-03-15 MED ORDER — TETANUS-DIPHTH-ACELL PERTUSSIS 5-2.5-18.5 LF-MCG/0.5 IM SUSP: 0.5000 mL | Freq: Once | INTRAMUSCULAR | Status: DC

## 2014-03-15 MED ORDER — ZOLPIDEM TARTRATE 5 MG PO TABS: 5.0000 mg | ORAL_TABLET | Freq: Every evening | ORAL | Status: DC | PRN

## 2014-03-15 MED ORDER — DIBUCAINE 1 % RE OINT: 1.0000 "application " | TOPICAL_OINTMENT | RECTAL | Status: DC | PRN

## 2014-03-15 NOTE — Plan of Care (Signed)
Problem: Phase I Progression Outcomes Goal: Pain controlled with appropriate interventions PATIENT'S PAIN IS UNDER CONTROL.

## 2014-03-15 NOTE — Anesthesia Postprocedure Evaluation (Signed)
  Anesthesia Post-op Note  Patient: Tracey Hill  Procedure(s) Performed: * No procedures listed *  Patient Location: Mother/Baby  Anesthesia Type:Epidural  Level of Consciousness: awake, alert , oriented and patient cooperative  Airway and Oxygen Therapy: Patient Spontanous Breathing  Post-op Pain: mild  Post-op Assessment: Post-op Vital signs reviewed, Patient's Cardiovascular Status Stable, Respiratory Function Stable, Patent Airway, No headache, No backache, No residual numbness and No residual motor weakness  Post-op Vital Signs: Reviewed and stable  Last Vitals:  Filed Vitals:   03/15/14 0528  BP: 101/51  Pulse: 75  Temp: 36.7 C  Resp: 16    Complications: No apparent anesthesia complications

## 2014-03-15 NOTE — Progress Notes (Addendum)
PPD #0 No problems Afeb, VSS Fundus firm, NT at U-1 Continue routine postpartum care 

## 2014-03-16 NOTE — Lactation Note (Signed)
This note was copied from the chart of Tracey Gwinda Maineachel Gremillion. Lactation Consultation Note Mom stated that the baby's didn't like the pregestimil and was spitting it up. Mom requested a different formula. Similac 22 given. Mom stated the other milk smelt terrible. Mom hasn't been post-pumping d/t it hurts. Asked mom to lower the suction level. Mom stated that she wasn't sure that she wanted to BF, that she may switch to formula and just bottle feed, that it was to much with the twins. Demonstrated to mom that she has colostrum and the purpose of post-pumping is because the LPI doesn't stimulate the breast as much as a FTI. Discussed the benefits of babies getting colostrum and putting baby's to breast and engorgement. Mom stated that she wants the baby's to gain weight so she doesn't have to stay extra time. I explained that usually LPI usually stay an extra day any ways to monitor the feedings. If mom wants to BF this is a great opportunity for her to get assistance while here. Mom wanted to know why couldn't the baby's have more than 5-3210ml formula. Reviewed the LPI feeding chart and explained to put baby's to breast first and mom saw she had colostrum and that then she could supplement after the breast if the baby's acted hungry. Grandmother at bedside. Patient Name: Tracey RingsGirlB Sophie Clenney ZOXWR'UToday's Date: Hill Reason for consult: Follow-up assessment   Maternal Data    Feeding Feeding Type: Breast Fed Nipple Type: Slow - flow Length of feed: 15 min  LATCH Score/Interventions                      Lactation Tools Discussed/Used     Consult Status Consult Status: Follow-up Date: 03/16/14 Follow-up type: In-patient    Charyl DancerCARVER, Marianna Cid G Hill, 3:42 AM

## 2014-03-16 NOTE — Lactation Note (Signed)
This note was copied from the chart of [name]. Lactation Consultation Note  Patient Name: [name] Today's Date: 03/16/2014 Reason for consult: Follow-up assessment Lactation visit to assess breastfeeding of late preterm twins. Mother states both babies are breastfeeding. Observed mother latch Twin B with ease and baby fed well with swallows. Mother can hand express colostrum. Milk ducts are filling. She reports feeding babies with cues and frequent feedings are noted in the chart. She is supplemented by bottle per guidelines. Mother requested formula to be switched to Neosure earlier due to baby spitting hydrolyzed formula. Babies are tolerating current formula well. Mother has pumped once and reports it being uncomfortable. She prefers to latch babies. If babies do not latch will pump to stimulate supply. Twins are feeding well. Mother has access to a breast pump either through her insurance or WIC after discharge. LC to follow as needed.  Maternal Data    Feeding Feeding Type: Breast Fed Length of feed: 10 min  LATCH Score/Interventions Latch: Grasps breast easily, tongue down, lips flanged, rhythmical sucking.  Audible Swallowing: A few with stimulation  Type of Nipple: Everted at rest and after stimulation  Comfort (Breast/Nipple): Soft / non-tender     Hold (Positioning): No assistance needed to correctly position infant at breast.  LATCH Score: 9  Lactation Tools Discussed/Used WIC Program: Yes   Consult Status Consult Status: Follow-up Date: 03/17/14 Follow-up type: In-patient    Khadir Roam M 03/16/2014, 3:37 PM    

## 2014-03-16 NOTE — Progress Notes (Signed)
PPD #1 No problems Afeb, VSS Fundus firm, NT at U-1 Continue routine postpartum care 

## 2014-03-17 MED ORDER — IBUPROFEN 600 MG PO TABS: 600.0000 mg | ORAL_TABLET | Freq: Four times a day (QID) | ORAL | Status: DC

## 2014-03-17 NOTE — Discharge Summary (Signed)
Obstetric Discharge Summary Reason for Admission: induction of labor Prenatal Procedures: NST and ultrasound Intrapartum Procedures: spontaneous vaginal delivery twins Postpartum Procedures: none Complications-Operative and Postpartum: none Hemoglobin  Date Value Ref Range Status  03/15/2014 9.8* 12.0 - 15.0 g/dL Final     HCT  Date Value Ref Range Status  03/15/2014 29.1* 36.0 - 46.0 % Final    Physical Exam:  General: alert Lochia: appropriate Uterine Fundus: firm   Discharge Diagnoses: Di/di twin pregnancy with 4/8 BPP for Baby B  Discharge Information: Date: 03/17/2014 Activity: pelvic rest Diet: routine Medications: Ibuprofen Condition: stable Instructions: refer to practice specific booklet Discharge to: home Follow-up Information   Follow up with Berle Fitz D, MD. Schedule an appointment as soon as possible for a visit in 6 weeks.   Specialty:  Obstetrics and Gynecology   Contact information:   [address], [place] [place] [id number] [phone]       Newborn Data:   Duanne GuessMoss, Girl Cassidi [[id number]][[id number]]  Live born female  Birth Weight: 4 lb 14.5 oz (2225 g) APGAR: 9, 9   Elzie RingsMoss, GirlB Nadya [[id number]][[id number]]  Live born female  Birth Weight: 5 lb 9.2 oz (2530 g) APGAR: 8, 9  Home with mother.  Jaylia Pettus D 03/17/2014, 5:05 PM

## 2014-03-17 NOTE — Discharge Instructions (Signed)
As per discharge pamphlet °
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
## 2014-03-17 NOTE — Lactation Note (Addendum)
This note was copied from the chart of Girl Gwinda MaineRachel Bosque. Lactation Consultation Note  Patient Name: Girl Gwinda MaineRachel Kolasa ZOXWR'UToday's Date: 03/17/2014 Reason for consult: Follow-up assessment  Consult Status Consult Status: Complete  Mom has been putting both babies to the breast often and she says they are nursing well. Mom reminded that sometimes LPI babies act/sound as if they are doing better at the breast then they actually are.  Thus, Mom shown on green LPI information sheet that she can supplement with higher volumes (last LS score reflects supplementation needs to continue per LPI policy).  Mom verbalizes understanding and says she will do so.   Baby A has gained 1 oz over 24 hours (total weight loss now at 4.3%).  Baby B has neither gained nor lost more weight (total weight loss remains at 7.5%).   Lurline HareRichey, Naren Benally [facility] 03/17/2014, 8:37 AM   RN asked to tell patient that Baby A does not need as much supplement as indicated above, secondary to Baby A's nice weight gain.

## 2014-03-17 NOTE — Plan of Care (Signed)
Problem: Phase II Progression Outcomes Goal: Hepatitis B vaccine given/parental consent Outcome: Completed/Met Date Met:  03/17/14 Mother declined Hep B vaccination.

## 2014-03-17 NOTE — Progress Notes (Signed)
PPD #2 Doing ok Afeb, VSS Fundus firm D/c home 

## 2014-03-27 ENCOUNTER — Encounter (HOSPITAL_COMMUNITY): Payer: Self-pay | Admitting: *Deleted

## 2015-12-14 IMAGING — US US OB COMP LESS 14 WK
1 series · 14 of 28 positions shown · non-contrast
Comparison: None.

CLINICAL DATA: Hyperemesis. Gestational age 8 weeks 4 days per LMP.
Patient refused transvaginal exam.

EXAM:
TWIN OBSTETRICAL ULTRASOUND <14 WKS

[Series 1: us ob comp less 14 wks · 42 acquisitions, 14 frames shown]
[im 2/42]
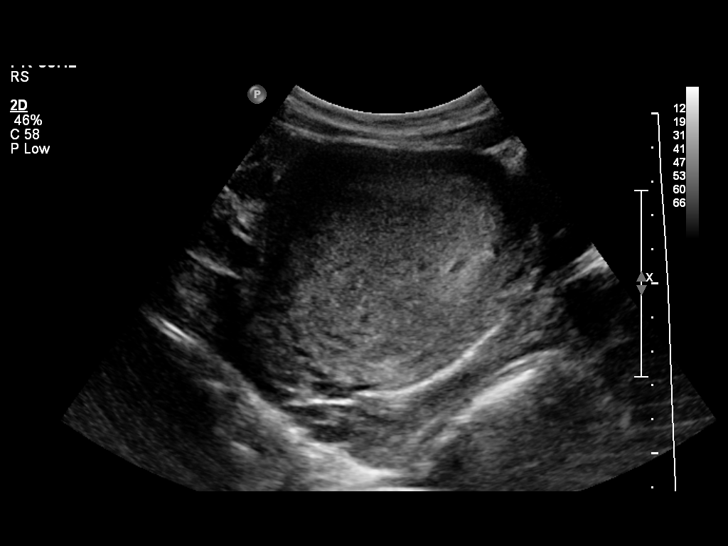
[im 5/42]
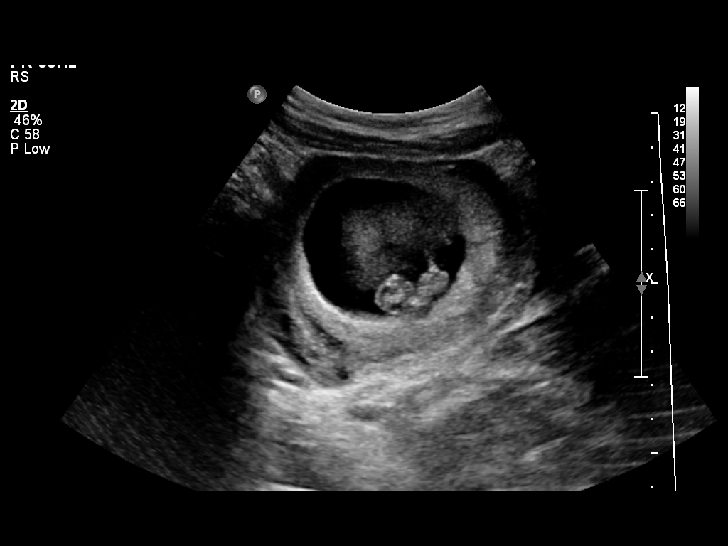
[im 8/42]
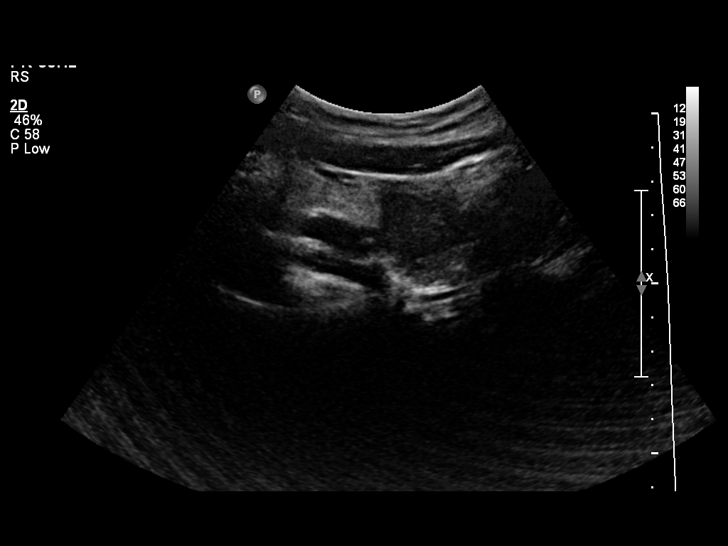
[im 11/42]
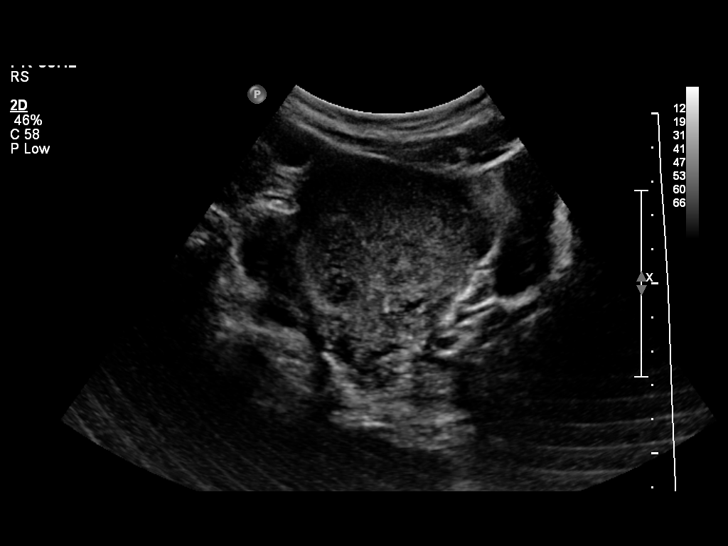
[im 14/42]
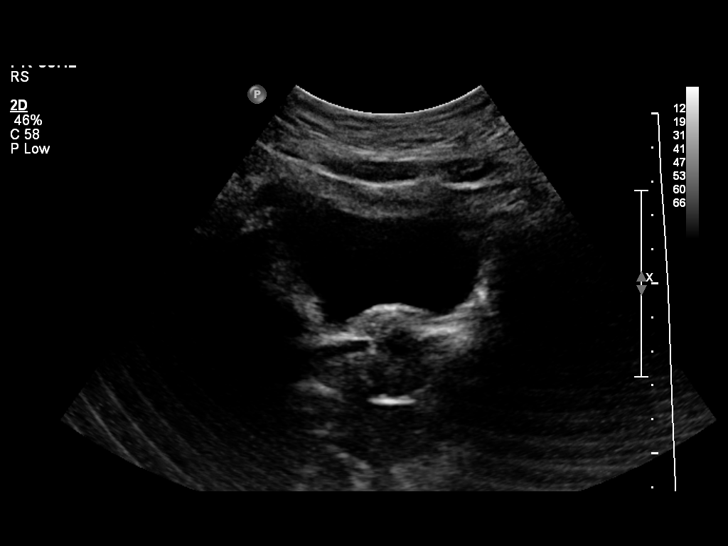
[im 17/42]
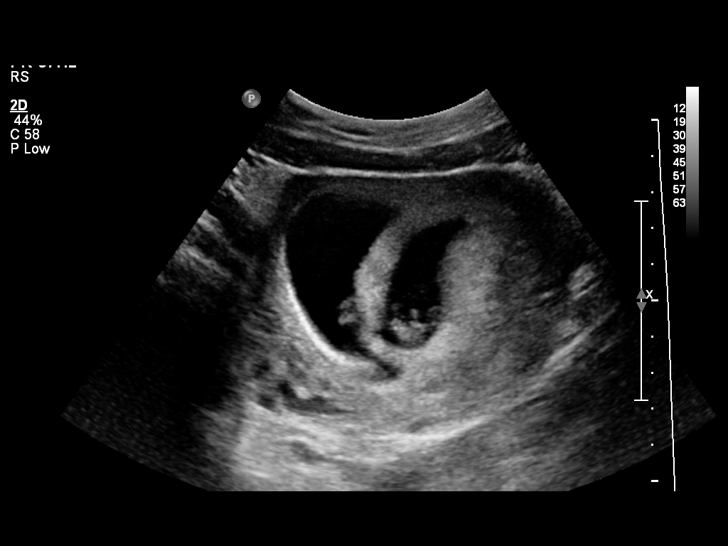
[im 20/42]
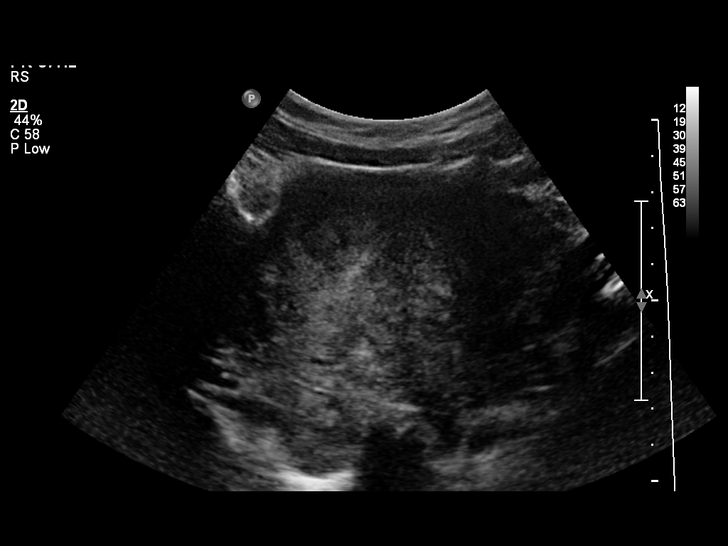
[im 23/42]
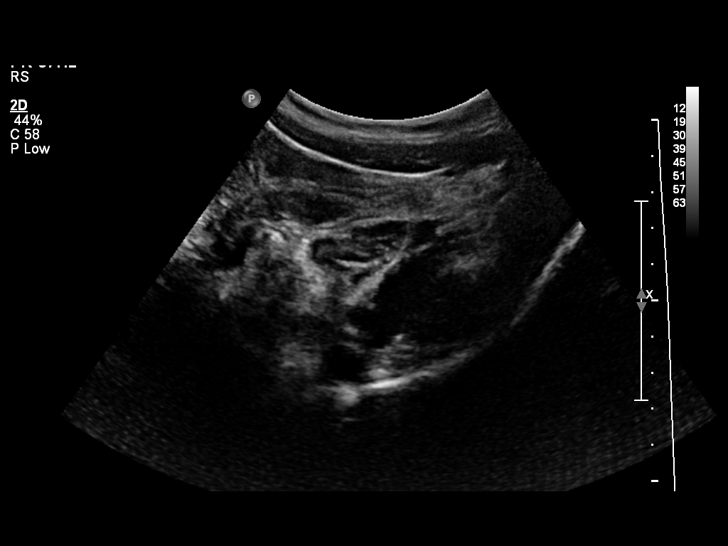
[im 26/42]
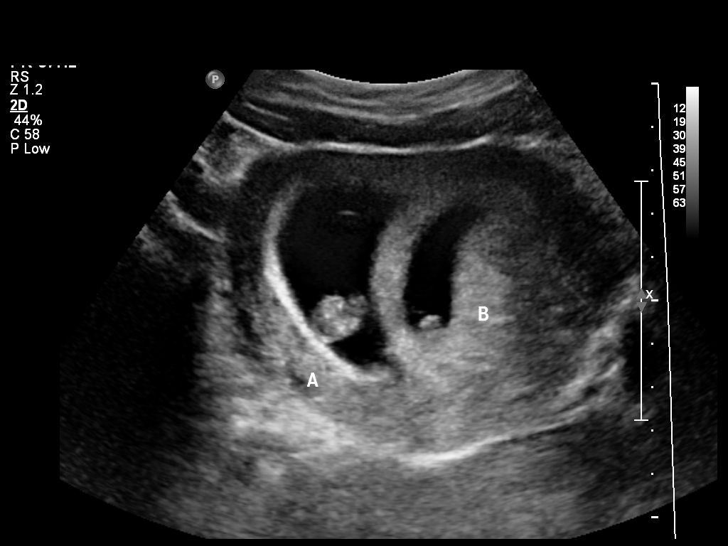
[im 29/42]
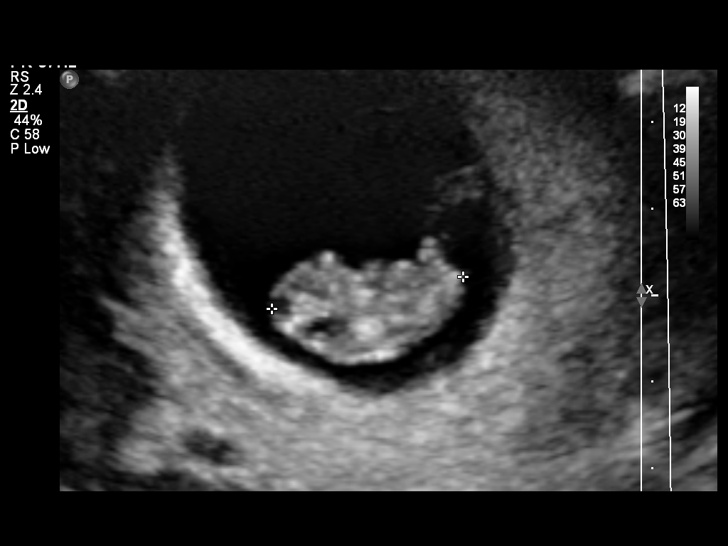
[im 32/42]
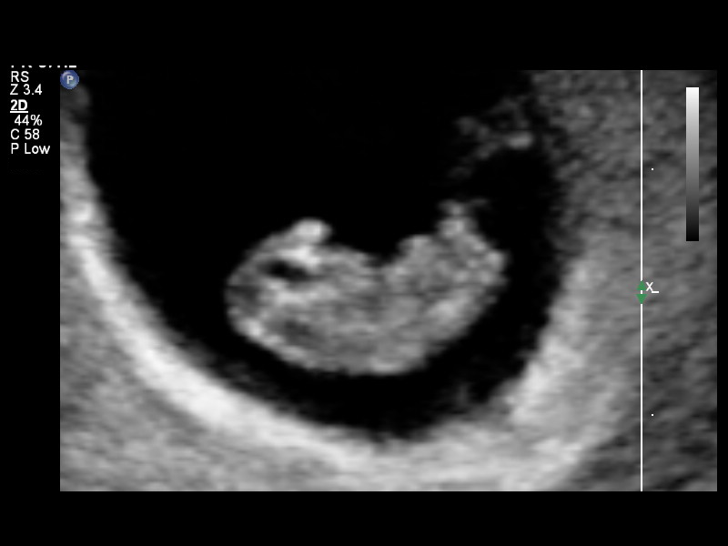
[im 35/42]
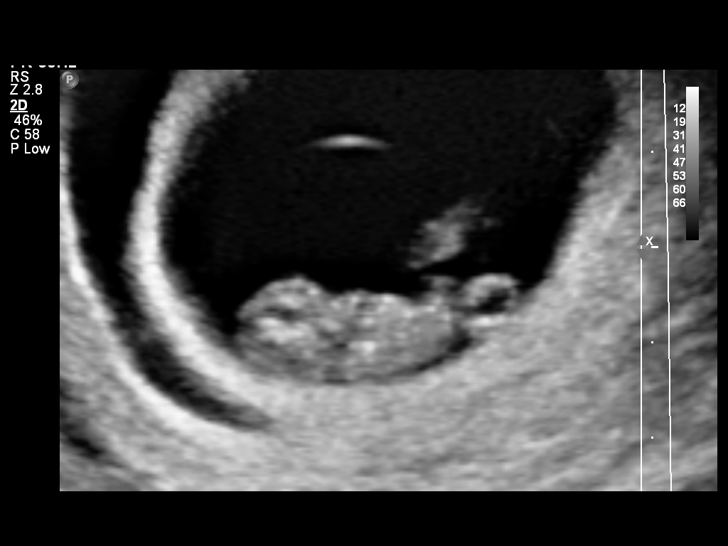
[im 38/42]
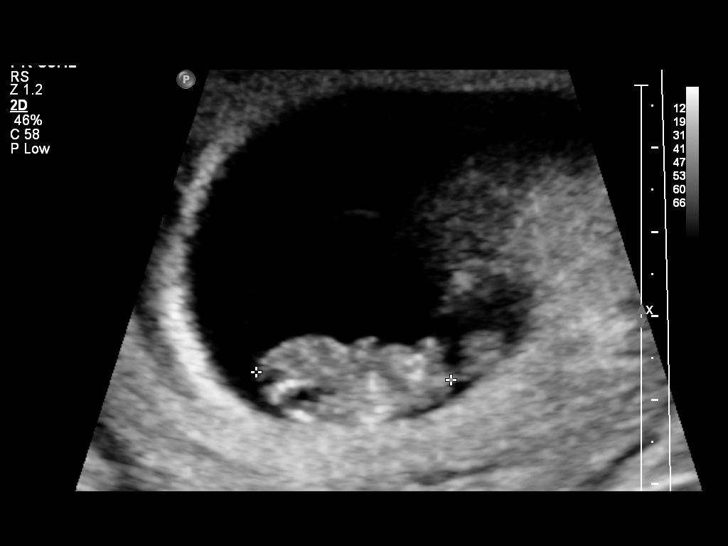
[im 42/42]
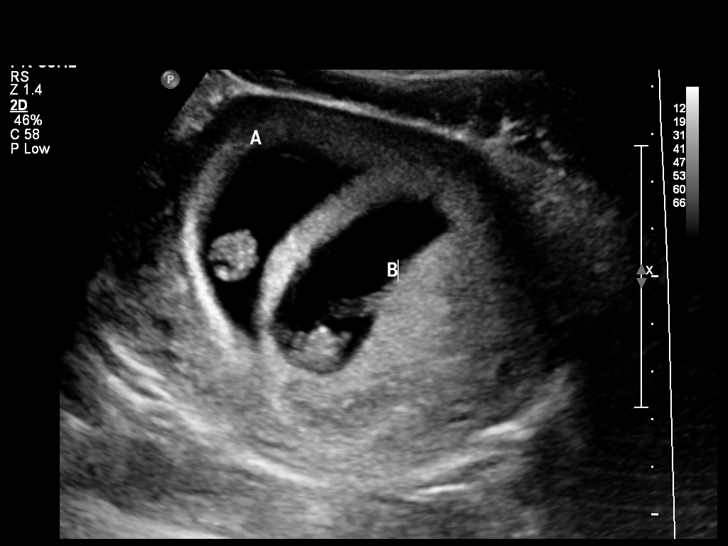

[14 of 28 positions shown; findings below may reference images not displayed]

FINDINGS: TWIN 1

Intrauterine gestational sac: Visualized/normal in shape.

Yolk sac:  Visualized.

Embryo:  Visualized.

Cardiac Activity: Visualized.

Heart Rate: 165 bpm

CRL:   22.2  mm   9 w 0 d                  US EDC: 04/06/2014

TWIN 2

Intrauterine gestational sac: Visualized/normal in shape.

Yolk sac:  Visualized.

Embryo:  Visualized.

Cardiac Activity: Visualized.

Heart Rate: 176 bpm

CRL:   23  mm   9 w 0 d                  US EDC: 04/06/2014

Maternal uterus/adnexae: No evidence of subchorionic hemorrhage.
Ovaries are not visualized. No free pelvic fluid noted.
IMPRESSION: Findings compatible would live twin gestation with estimated
gestational age of each twin 9 weeks 0 days.

## 2016-12-20 ENCOUNTER — Inpatient Hospital Stay (HOSPITAL_COMMUNITY): Payer: Medicaid Other

## 2016-12-20 ENCOUNTER — Inpatient Hospital Stay (HOSPITAL_COMMUNITY)
Admission: AD | Admit: 2016-12-20 | Discharge: 2016-12-23 | DRG: 779 | Disposition: A | Discharge status: Home / Self Care | Payer: Medicaid Other | Source: Ambulatory Visit | Attending: Obstetrics and Gynecology | Admitting: Obstetrics and Gynecology

## 2016-12-20 ENCOUNTER — Encounter (HOSPITAL_COMMUNITY): Payer: Self-pay

## 2016-12-20 DIAGNOSIS — A749 Chlamydial infection, unspecified: Secondary | ICD-10-CM

## 2016-12-20 DIAGNOSIS — O035 Genital tract and pelvic infection following complete or unspecified spontaneous abortion: Secondary | ICD-10-CM | POA: Diagnosis present

## 2016-12-20 DIAGNOSIS — A5611 Chlamydial female pelvic inflammatory disease: Secondary | ICD-10-CM | POA: Diagnosis present

## 2016-12-20 DIAGNOSIS — O4690 Antepartum hemorrhage, unspecified, unspecified trimester: Secondary | ICD-10-CM

## 2016-12-20 DIAGNOSIS — N719 Inflammatory disease of uterus, unspecified: Secondary | ICD-10-CM | POA: Diagnosis present

## 2016-12-20 LAB — COMPREHENSIVE METABOLIC PANEL
ALBUMIN: 3.6 g/dL (ref 3.5–5.0)
ALT: 12 U/L — ABNORMAL LOW (ref 14–54)
ANION GAP: 11 (ref 5–15)
AST: 22 U/L (ref 15–41)
Alkaline Phosphatase: 42 U/L (ref 38–126)
BUN: 7 mg/dL (ref 6–20)
CO2: 19 mmol/L — AB (ref 22–32)
Calcium: 8.3 mg/dL — ABNORMAL LOW (ref 8.9–10.3)
Chloride: 103 mmol/L (ref 101–111)
Creatinine, Ser: 0.63 mg/dL (ref 0.44–1.00)
GFR calc non Af Amer: >60 mL/min (ref >60)
GLUCOSE: 141 mg/dL — AB (ref 65–99)
POTASSIUM: 3 mmol/L — AB (ref 3.5–5.1)
Sodium: 133 mmol/L — ABNORMAL LOW (ref 135–145)
Total Bilirubin: 1.1 mg/dL (ref 0.3–1.2)
Total Protein: 7.1 g/dL (ref 6.5–8.1)

## 2016-12-20 LAB — CBC WITH DIFFERENTIAL/PLATELET
BASOS PCT: 0 %
Basophils Absolute: 0 10*3/uL (ref 0.0–0.1)
EOS PCT: 0 %
Eosinophils Absolute: 0 10*3/uL (ref 0.0–0.7)
HCT: 34.3 % — ABNORMAL LOW (ref 36.0–46.0)
Hemoglobin: 11.6 g/dL — ABNORMAL LOW (ref 12.0–15.0)
Lymphocytes Relative: 7 %
Lymphs Abs: 1 10*3/uL (ref 0.7–4.0)
MCH: 29.1 pg (ref 26.0–34.0)
MCHC: 33.8 g/dL (ref 30.0–36.0)
MCV: 86.2 fL (ref 78.0–100.0)
Monocytes Absolute: 0.3 10*3/uL (ref 0.1–1.0)
Monocytes Relative: 2 %
NEUTROS PCT: 91 %
Neutro Abs: 14.3 10*3/uL — ABNORMAL HIGH (ref 1.7–7.7)
PLATELETS: 200 10*3/uL (ref 150–400)
RBC: 3.98 MIL/uL (ref 3.87–5.11)
RDW: 13.5 % (ref 11.5–15.5)
WBC: 15.7 10*3/uL — ABNORMAL HIGH (ref 4.0–10.5)

## 2016-12-20 LAB — LACTIC ACID, PLASMA
LACTIC ACID, VENOUS: 2.1 mmol/L — AB (ref 0.5–1.9)
Lactic Acid, Venous: 3.2 mmol/L (ref 0.5–1.9)

## 2016-12-20 LAB — URINALYSIS, ROUTINE W REFLEX MICROSCOPIC
Bilirubin Urine: NEGATIVE
Glucose, UA: NEGATIVE mg/dL
Ketones, ur: 5 mg/dL — AB
NITRITE: NEGATIVE
Protein, ur: NEGATIVE mg/dL
Specific Gravity, Urine: 1.017 (ref 1.005–1.030)
pH: 7 (ref 5.0–8.0)

## 2016-12-20 LAB — WET PREP, GENITAL
Clue Cells Wet Prep HPF POC: NONE SEEN
Sperm: NONE SEEN
TRICH WET PREP: NONE SEEN
Yeast Wet Prep HPF POC: NONE SEEN

## 2016-12-20 LAB — POCT PREGNANCY, URINE: PREG TEST UR: POSITIVE — AB

## 2016-12-20 MED ORDER — LACTATED RINGERS IV BOLUS (SEPSIS)
1000.0000 mL | Freq: Once | INTRAVENOUS | Status: AC
  Administered 2016-12-20: 1000 mL via INTRAVENOUS

## 2016-12-20 MED ORDER — OXYCODONE-ACETAMINOPHEN 5-325 MG PO TABS
1.0000 | ORAL_TABLET | Freq: Once | ORAL | Status: AC
  Administered 2016-12-20: 1 via ORAL
  Filled 2016-12-20: qty 1

## 2016-12-20 MED ORDER — IBUPROFEN 600 MG PO TABS
600.0000 mg | ORAL_TABLET | Freq: Four times a day (QID) | ORAL | Status: DC | PRN
  Administered 2016-12-20 – 2016-12-22 (×4): 600 mg via ORAL
  Filled 2016-12-20 (×4): qty 1

## 2016-12-20 MED ORDER — PIPERACILLIN-TAZOBACTAM 3.375 G IVPB
3.3750 g | Freq: Three times a day (TID) | INTRAVENOUS | Status: DC
  Administered 2016-12-20 – 2016-12-22 (×5): 3.375 g via INTRAVENOUS
  Filled 2016-12-20 (×6): qty 50

## 2016-12-20 MED ORDER — PRENATAL MULTIVITAMIN CH
1.0000 | ORAL_TABLET | Freq: Every day | ORAL | Status: DC
  Administered 2016-12-21: 1 via ORAL
  Filled 2016-12-20: qty 1

## 2016-12-20 MED ORDER — OXYCODONE-ACETAMINOPHEN 5-325 MG PO TABS: 1.0000 | ORAL_TABLET | ORAL | Status: DC | PRN

## 2016-12-20 MED ORDER — LACTATED RINGERS IV SOLN
INTRAVENOUS | Status: DC
  Administered 2016-12-20 – 2016-12-21 (×4): via INTRAVENOUS

## 2016-12-20 MED ORDER — LACTATED RINGERS IV SOLN
INTRAVENOUS | Status: DC
  Administered 2016-12-20: 19:00:00 via INTRAVENOUS

## 2016-12-20 MED ORDER — ACETAMINOPHEN 500 MG PO TABS
1000.0000 mg | ORAL_TABLET | Freq: Four times a day (QID) | ORAL | Status: DC | PRN
Start: 2016-12-20 — End: 2016-12-23
  Administered 2016-12-20: 1000 mg via ORAL
  Filled 2016-12-20: qty 2

## 2016-12-20 NOTE — MAU Provider Note (Signed)
History   Pt took misoprostal on tues to induce ab states passed POC early Wednesday morning, thought everything was ok but started feeling really bad last night, worse today and severe abd pain.  CSN: [id number]  Arrival date & time 12/20/16  1534   None     Chief Complaint  Patient presents with  . Pelvic Pain  . Abdominal Pain    HPI  Past Medical History:  Diagnosis Date  . Heart murmur    ? first trimester  . Infection    UTI  . Pelvic inflammatory disease (PID)     Past Surgical History:  Procedure Laterality Date  . NO PAST SURGERIES      Family History  Problem Relation Age of Onset  . Hypertension Father   . Diabetes Maternal Grandfather   . Hearing loss Neg Hx     Social History  Substance Use Topics  . Smoking status: Never Smoker  . Smokeless tobacco: Never Used  . Alcohol use No    OB History    Gravida Para Term Preterm AB Living   4 2 1 1 2 3    SAB TAB Ectopic Multiple Live Births     2   1 3       Review of Systems  Constitutional: Positive for fatigue.  HENT: Negative.   Eyes: Negative.   Respiratory: Negative.   Cardiovascular: Negative.   Gastrointestinal: Positive for abdominal pain.  Endocrine: Negative.   Genitourinary: Positive for pelvic pain and vaginal bleeding.  Musculoskeletal: Negative.   Skin: Negative.   Allergic/Immunologic: Negative.   Neurological: Negative.   Hematological: Negative.   Psychiatric/Behavioral: Negative.     Allergies  Macrobid [nitrofurantoin]  Home Medications    BP (!) 113/55   Pulse (!) 102   Temp (!) 101.1 F (38.4 C) (Oral)   Resp 16   Wt 180 lb (81.6 kg)   LMP 10/24/2016   BMI 31.89 kg/m   Physical Exam  Constitutional: She is oriented to person, place, and time. She appears well-developed and well-nourished.  Neck: Normal range of motion.  Cardiovascular: Normal rate, regular rhythm, normal heart sounds and intact distal pulses.   Pulmonary/Chest: Effort normal and breath  sounds normal.  Abdominal: Soft. Bowel sounds are normal. There is tenderness.  Genitourinary: Vaginal discharge found.  Musculoskeletal: Normal range of motion.  Neurological: She is alert and oriented to person, place, and time. She has normal reflexes.  Skin: Skin is warm and dry.  Psychiatric: She has a normal mood and affect. Her behavior is normal. Judgment and thought content normal.    MAU Course  Procedures (including critical care time)  Labs Reviewed  WET PREP, GENITAL - Abnormal; Notable for the following:       Result Value   WBC, Wet Prep HPF POC MANY (*)    All other components within normal limits  URINALYSIS, ROUTINE W REFLEX MICROSCOPIC - Abnormal; Notable for the following:    APPearance HAZY (*)    Hgb urine dipstick LARGE (*)    Ketones, ur 5 (*)    Leukocytes, UA LARGE (*)    Bacteria, UA RARE (*)    Squamous Epithelial / LPF 0-5 (*)    All other components within normal limits  POCT PREGNANCY, URINE - Abnormal; Notable for the following:    Preg Test, Ur POSITIVE (*)    All other components within normal limits  CBC WITH DIFFERENTIAL/PLATELET  COMPREHENSIVE METABOLIC PANEL  GC/CHLAMYDIA PROBE AMP ([place])  NOT AT [facility]  BP (!) 113/55   Pulse (!) 102   Temp (!) 101.1 F (38.4 C) (Oral)   Resp 16   Wt 180 lb (81.6 kg)   LMP 10/24/2016   BMI 31.89 kg/m  No results found.   1. Vaginal bleeding in pregnancy       MDM  Temp 101.1. Sterile spec exam, mucopurulent discharge from cervix noted.  cultures obtained, severe CMT with exam. Wet prep TNTC WBC's. CBC elevated WBC at 15.7. Urine culture pending, blood culture pending, lactic acid pending. Plan admit  Iv antibiotics Antipyretics Pain management.

## 2016-12-20 NOTE — H&P (Signed)
Pt took misoprostal on tues to induce ab states passed POC early Wednesday morning, thought everything was ok but started feeling really bad last night, worse today and severe abd pain.  CSN: [id number]  Arrival date & time 12/20/16  1534   None        Chief Complaint  Patient presents with  . Pelvic Pain  . Abdominal Pain    HPI      Past Medical History:  Diagnosis Date  . Heart murmur    ? first trimester  . Infection    UTI  . Pelvic inflammatory disease (PID)          Past Surgical History:  Procedure Laterality Date  . NO PAST SURGERIES           Family History  Problem Relation Age of Onset  . Hypertension Father   . Diabetes Maternal Grandfather   . Hearing loss Neg Hx         Social History  Substance Use Topics  . Smoking status: Never Smoker  . Smokeless tobacco: Never Used  . Alcohol use No            OB History    Gravida Para Term Preterm AB Living   4 2 1 1 2 3    SAB TAB Ectopic Multiple Live Births     2   1 3       Review of Systems  Constitutional: Positive for fatigue.  HENT: Negative.   Eyes: Negative.   Respiratory: Negative.   Cardiovascular: Negative.   Gastrointestinal: Positive for abdominal pain.  Endocrine: Negative.   Genitourinary: Positive for pelvic pain and vaginal bleeding.  Musculoskeletal: Negative.   Skin: Negative.   Allergic/Immunologic: Negative.   Neurological: Negative.   Hematological: Negative.   Psychiatric/Behavioral: Negative.     Allergies  Macrobid [nitrofurantoin]  Home Medications    BP (!) 113/55   Pulse (!) 102   Temp (!) 101.1 F (38.4 C) (Oral)   Resp 16   Wt 180 lb (81.6 kg)   LMP 10/24/2016   BMI 31.89 kg/m   Physical Exam  Constitutional: She is oriented to person, place, and time. She appears well-developed and well-nourished.  Neck: Normal range of motion.  Cardiovascular: Normal rate, regular rhythm, normal heart sounds and  intact distal pulses.   Pulmonary/Chest: Effort normal and breath sounds normal.  Abdominal: Soft. Bowel sounds are normal. There is tenderness.  Genitourinary: Vaginal discharge found.  Musculoskeletal: Normal range of motion.  Neurological: She is alert and oriented to person, place, and time. She has normal reflexes.  Skin: Skin is warm and dry.  Psychiatric: She has a normal mood and affect. Her behavior is normal. Judgment and thought content normal.    MAU Course  Procedures (including critical care time)       Labs Reviewed  WET PREP, GENITAL - Abnormal; Notable for the following:       Result Value    WBC, Wet Prep HPF POC MANY (*)    All other components within normal limits  URINALYSIS, ROUTINE W REFLEX MICROSCOPIC - Abnormal; Notable for the following:    APPearance HAZY (*)    Hgb urine dipstick LARGE (*)    Ketones, ur 5 (*)    Leukocytes, UA LARGE (*)    Bacteria, UA RARE (*)    Squamous Epithelial / LPF 0-5 (*)    All other components within normal limits  POCT PREGNANCY, URINE - Abnormal; Notable for the  following:    Preg Test, Ur POSITIVE (*)    All other components within normal limits  CBC WITH DIFFERENTIAL/PLATELET  COMPREHENSIVE METABOLIC PANEL  GC/CHLAMYDIA PROBE AMP ([place]) NOT AT [facility]  BP (!) 113/55   Pulse (!) 102   Temp (!) 101.1 F (38.4 C) (Oral)   Resp 16   Wt 180 lb (81.6 kg)   LMP 10/24/2016   BMI 31.89 kg/m  Imaging Results (Last 48 hours)  No results found.     1. Vaginal bleeding in pregnancy       MDM  Temp 101.1. Sterile spec exam, mucopurulent discharge from cervix noted.  cultures obtained, severe CMT with exam. Wet prep TNTC WBC's. CBC elevated WBC at 15.7. Urine culture pending, blood culture pending, lactic acid pending. Plan admit  Iv antibiotics Antipyretics Pain management.

## 2016-12-20 NOTE — MAU Note (Signed)
Has an abortion on Tuesday and is still in pain. Feeling right sided abdom pain

## 2016-12-21 DIAGNOSIS — O035 Genital tract and pelvic infection following complete or unspecified spontaneous abortion: Principal | ICD-10-CM

## 2016-12-21 LAB — CBC
HCT: 31.4 % — ABNORMAL LOW (ref 36.0–46.0)
HEMOGLOBIN: 10.8 g/dL — AB (ref 12.0–15.0)
MCH: 29.4 pg (ref 26.0–34.0)
MCHC: 34.4 g/dL (ref 30.0–36.0)
MCV: 85.6 fL (ref 78.0–100.0)
Platelets: 180 10*3/uL (ref 150–400)
RBC: 3.67 MIL/uL — AB (ref 3.87–5.11)
RDW: 13.8 % (ref 11.5–15.5)
WBC: 10.6 10*3/uL — AB (ref 4.0–10.5)

## 2016-12-21 NOTE — Progress Notes (Signed)
Transfer care, [facility]udg[facility].

## 2016-12-21 NOTE — Plan of Care (Signed)
Problem: Fluid Volume: Goal: Hemodynamic stability will improve Outcome: Progressing IV fluid amount as per MD order  Problem: Physical Regulation: Goal: Diagnostic test results will improve Outcome: Completed/Met Date Met: 12/21/16 Lactic acids levels 2.1 & 3.2 Goal: Signs and symptoms of infection will decrease Outcome: Completed/Met Date Met: 12/21/16 Blood Culture collected, pending results. Zosyn IB antibiotics administered, Tylenol po for elevated temp. WBC 15.7

## 2016-12-21 NOTE — Plan of Care (Signed)
Problem: Physical Regulation: Goal: Ability to maintain clinical measurements within normal limits will improve Outcome: Progressing Pt has been without fever since 2100 last night. Sheryn BisonGordon, Arbie Reisz Warner  Goal: Will remain free from infection Outcome: Progressing Temperatures are now WNL. Pt currently denies any pain, and states that she feels much better. Pt is smiling and has had a few supportive visitors today. Sheryn BisonGordon, Jaylina Ramdass Warner    Problem: Tissue Perfusion: Goal: Risk factors for ineffective tissue perfusion will decrease Outcome: Progressing Pt encouraged to use SCDs during periods of rest, educated that when she is ambulating frequently it is ok to take breaks, but otherwise, she needs to wear the SCDs.

## 2016-12-21 NOTE — Progress Notes (Signed)
Subjective: Patient reports feeling better. She reports improvement of her pain which is still present.She denies nausea or emesis    Objective: I have reviewed patient's vital signs and labs.  General: alert, cooperative and no distress Resp: clear to auscultation bilaterally Cardio: regular rate and rhythm GI: soft, nondistended, tenderness to palpation in the suprapubic and RLQ region Extremities: extremities normal, atraumatic, no cyanosis or edema and Homans sign is negative, no sign of DVT   Assessment/Plan: 26 yo [id number] with post abortion endometritis - Patient afebrile since 9 pm - Continue IV antibiotics - Follow up blood and urine cultures - Continue inpatient care until 48 hour afebrile   LOS: 1 day    Terina Mcelhinny 12/21/2016, 6:45 AM

## 2016-12-21 NOTE — Progress Notes (Signed)
Received patient asleep in bed, Zosyn IVPB infusing @12 .755mls/hr. Family member asleep on Sofa bed. We will continue to monitor.

## 2016-12-22 LAB — CBC WITH DIFFERENTIAL/PLATELET
Basophils Absolute: 0 10*3/uL (ref 0.0–0.1)
Basophils Relative: 0 %
EOS ABS: 0.1 10*3/uL (ref 0.0–0.7)
EOS PCT: 1 %
HEMATOCRIT: 29.8 % — AB (ref 36.0–46.0)
HEMOGLOBIN: 10.1 g/dL — AB (ref 12.0–15.0)
LYMPHS ABS: 1.7 10*3/uL (ref 0.7–4.0)
Lymphocytes Relative: 22 %
MCH: 29.2 pg (ref 26.0–34.0)
MCHC: 33.9 g/dL (ref 30.0–36.0)
MCV: 86.1 fL (ref 78.0–100.0)
MONOS PCT: 3 %
Monocytes Absolute: 0.3 10*3/uL (ref 0.1–1.0)
NEUTROS PCT: 74 %
Neutro Abs: 5.7 10*3/uL (ref 1.7–7.7)
Platelets: 181 10*3/uL (ref 150–400)
RBC: 3.46 MIL/uL — ABNORMAL LOW (ref 3.87–5.11)
RDW: 13.9 % (ref 11.5–15.5)
WBC: 7.8 10*3/uL (ref 4.0–10.5)

## 2016-12-22 LAB — TYPE AND SCREEN
ABO/RH(D): B POS
Antibody Screen: NEGATIVE

## 2016-12-22 LAB — BASIC METABOLIC PANEL
Anion gap: 5 (ref 5–15)
BUN: 5 mg/dL — AB (ref 6–20)
CHLORIDE: 110 mmol/L (ref 101–111)
CO2: 23 mmol/L (ref 22–32)
CREATININE: 0.6 mg/dL (ref 0.44–1.00)
Calcium: 8 mg/dL — ABNORMAL LOW (ref 8.9–10.3)
GFR calc Af Amer: >60 mL/min (ref >60)
GFR calc non Af Amer: >60 mL/min (ref >60)
GLUCOSE: 113 mg/dL — AB (ref 65–99)
Potassium: 3.4 mmol/L — ABNORMAL LOW (ref 3.5–5.1)
Sodium: 138 mmol/L (ref 135–145)

## 2016-12-22 LAB — URINE CULTURE

## 2016-12-22 LAB — GC/CHLAMYDIA PROBE AMP (~~LOC~~) NOT AT ARMC
Chlamydia: POSITIVE — AB
Neisseria Gonorrhea: NEGATIVE

## 2016-12-22 LAB — LACTIC ACID, PLASMA: Lactic Acid, Venous: 0.6 mmol/L (ref 0.5–1.9)

## 2016-12-22 MED ORDER — OXYCODONE-ACETAMINOPHEN 5-325 MG PO TABS: 1.0000 | ORAL_TABLET | Freq: Four times a day (QID) | ORAL | Status: DC | PRN

## 2016-12-22 MED ORDER — AMOXICILLIN-POT CLAVULANATE 875-125 MG PO TABS
1.0000 | ORAL_TABLET | Freq: Two times a day (BID) | ORAL | Status: DC
  Administered 2016-12-22 (×2): 1 via ORAL
  Filled 2016-12-22 (×2): qty 1

## 2016-12-22 NOTE — Discharge Summary (Signed)
Discharge Summary   Admit Date: 12/20/2016 Discharge Date: 12/23/2016 Discharging Service: Gynecology  Primary OBGYN: None Admitting Physician: Catalina AntiguaPeggy Constant, MD  Discharge Physician: Vergie LivingPickens  Referring Provider: MAU  Primary Care Provider: Patient, No Pcp Per  Admission Diagnoses: *Endometritis after abortion  Discharge Diagnoses: *Resolved endometritis *chlamydia *UTI  Consult Orders: None   Surgeries/Procedures Performed: none  History and Physical: Cosigned byCatalina Antigua: Constant, Peggy, MD at 12/20/2016 8:32 PM  Attestation signed by Catalina Antiguaonstant, Peggy, MD at 12/20/2016 8:32 PM  Attestation of Attending Supervision of Advanced Practitioner (CNM/NP): Evaluation and management procedures were performed by the Advanced Practitioner under my supervision and collaboration.  I have reviewed the Advanced Practitioner's note and chart, and I agree with the management and plan.  Peggy Constant 12/20/2016 8:32 PM        [] Hide copied text Pt took misoprostal on tues to induce ab states passed POC early Wednesday morning, thought everything was ok but started feeling really bad last night, worse today and severe abd pain.  CSN: [id number]  Arrival date &time 7/28/181534  None       Chief Complaint  Patient presents with  . Pelvic Pain  . Abdominal Pain    HPI      Past Medical History:  Diagnosis Date  . Heart murmur    ? first trimester  . Infection    UTI  . Pelvic inflammatory disease (PID)          Past Surgical History:  Procedure Laterality Date  . NO PAST SURGERIES           Family History  Problem Relation Age of Onset  . Hypertension Father   . Diabetes Maternal Grandfather   . Hearing loss Neg Hx         Social History  Substance Use Topics  . Smoking status: Never Smoker  . Smokeless tobacco: Never Used  . Alcohol use No            OB History   Gravida Para Term Preterm AB Living     4 2 1 1 2 3    SAB TAB Ectopic Multiple Live Births    2  1 3       Review of Systems  Constitutional: Positive for fatigue.  HENT: Negative.  Eyes: Negative.  Respiratory: Negative.  Cardiovascular: Negative.  Gastrointestinal: Positive for abdominal pain.  Endocrine: Negative.  Genitourinary: Positive for pelvic painand vaginal bleeding.  Musculoskeletal: Negative.  Skin: Negative.  Allergic/Immunologic: Negative.  Neurological: Negative.  Hematological: Negative.  Psychiatric/Behavioral: Negative.    Allergies  Macrobid [nitrofurantoin]  Home Medications    BP (!) 113/55  Pulse (!) 102  Temp (!) 101.1 F (38.4 C) (Oral)  Resp 16  Wt 180 lb (81.6 kg)  LMP 10/24/2016  BMI 31.89 kg/m   Physical Exam Constitutional: She is oriented to person, place, and time. She appears well-developedand well-nourished.  Neck: Normal range of motion.  Cardiovascular: Normal rate, regular rhythm, normal heart soundsand intact distal pulses.  Pulmonary/Chest: Effort normaland breath sounds normal.  Abdominal: Soft. Bowel sounds are normal. There is tenderness.  Genitourinary: Vaginal dischargefound.  Musculoskeletal: Normal range of motion.  Neurological: She is alertand oriented to person, place, and time. She has normal reflexes.  Skin: Skin is warmand dry.  Psychiatric: She has a normal mood and affect. Her behavior is normal. Judgmentand thought contentnormal.    MAU Course  Procedures(including critical care time)       Labs Reviewed  WET PREP, GENITAL -  Abnormal; Notable for the following:   Result Value    WBC, Wet Prep HPF POC MANY (*)    All other components within normal limits  URINALYSIS, ROUTINE W REFLEX MICROSCOPIC - Abnormal; Notable for the following:    APPearance HAZY (*)    Hgb urine dipstick LARGE (*)    Ketones, ur 5 (*)    Leukocytes, UA LARGE (*)    Bacteria, UA RARE (*)     Squamous Epithelial / LPF 0-5 (*)    All other components within normal limits  POCT PREGNANCY, URINE - Abnormal; Notable for the following:    Preg Test, Ur POSITIVE (*)    All other components within normal limits  CBC WITH DIFFERENTIAL/PLATELET  COMPREHENSIVE METABOLIC PANEL  GC/CHLAMYDIA PROBE AMP ([place]) NOT AT [facility]  BP (!) 113/55  Pulse (!) 102  Temp (!) 101.1 F (38.4 C) (Oral)  Resp 16  Wt 180 lb (81.6 kg)  LMP 10/24/2016  BMI 31.89 kg/m  Imaging Results (Last 48 hours)  No results found.     1. Vaginal bleeding in pregnancy       MDM  Temp 101.1. Sterile spec exam, mucopurulent discharge from cervix noted. cultures obtained, severe CMT with exam. Wet prep TNTC WBC's. CBC elevated WBC at 15.7. Urine culture pending, blood culture pending, lactic acid pending. Plan admit  Iv antibiotics Antipyretics Pain management.     Electronically signed by Montez MoritaLawson, Marie D, CNM at 12/20/2016 6:03 PM Electronically signed by Catalina Antiguaonstant, Peggy, MD at 12/20/2016 8:32 PM    Hospital Course: *GYN: transition to augmentin bid on HD#3 after being on zosyn since admission. Pt did great after being on augmentin while inpatient. Patient diagnosed with chlamydia during admission.  *Renal: diagnosed with >50k group c streptococcus *Pain: no needs *FEN/GI: SLIV, regular diet *PPx: SCDs, OOB ad lib  Discharge Exam:    Current Vital Signs 24h Vital Sign Ranges  T 98.3 F (36.8 C) Temp  Avg: 98.5 F (36.9 C)  Min: 98.1 F (36.7 C)  Max: 99 F (37.2 C)  BP (!) 104/58 BP  Min: 104/58  Max: 114/66  HR 67 Pulse  Avg: 71.5  Min: 66  Max: 80  RR 18 Resp  Avg: 18  Min: 18  Max: 18  SaO2 98 % Not Delivered SpO2  Avg: 99.7 %  Min: 98 %  Max: 100 %       24 Hour I/O Current Shift I/O  Time Ins Outs No intake/output data recorded. No intake/output data recorded.   Last temp: 7/28 @ 2100 39.2  Physical exam: General appearance: alert, cooperative and appears  stated age Abdomen: soft, non-tender; bowel sounds normal; no masses,  no organomegaly GU: No gross VB Lungs: clear to auscultation bilaterally Heart: S1, S2 normal, no murmur, rub or gallop, regular rate and rhythm Extremities: no c/c/e Skin: warm and dry Psych: appropriate Neurologic: Grossly normal  Discharge Disposition:  Home  Patient Instructions:  Standard   Results Pending at Discharge:  BCx x 2. NGTD on discharge  Discharge Medications: Allergies as of 12/23/2016      Reactions   Macrobid [nitrofurantoin] Rash      Medication List    TAKE these medications   acetaminophen 500 MG tablet Commonly known as:  TYLENOL Take 2 tablets (1,000 mg total) by mouth every 6 (six) hours as needed for fever.   amoxicillin-clavulanate 875-125 MG tablet Commonly known as:  AUGMENTIN Take 1 tablet by mouth every 12 (twelve)  hours.   ibuprofen 600 MG tablet Commonly known as:  ADVIL,MOTRIN Take 1 tablet (600 mg total) by mouth every 6 (six) hours as needed (mild pain).        No future appointments.  Request sent to clinic for 1-2wk follow up. Can d/w pt re: chlamydia then. Pt with partner during rounds.   Cornelia Copa MD Attending Center for [facility])

## 2016-12-22 NOTE — Progress Notes (Addendum)
Gynecology Progress Note  Admission Date: 12/20/2016 Current Date: 12/22/2016 11:16 AM  Tracey Hill is a 26 y.o. [id number] HD#3 admitted for septic AB s/p EAB via medicines   History complicated by: nothing  ROS and patient/family/surgical history, located on admission H&P note dated 12/20/2016, have been reviewed, and there are no changes except as noted below Yesterday/Overnight Events:  none  Subjective:  No fevers, chills, pain, nausea, vomiting, VB. Pt would like to go home today  Objective:    Current Vital Signs 24h Vital Sign Ranges  T 99 F (37.2 C) Temp  Avg: 98.7 F (37.1 C)  Min: 97.9 F (36.6 C)  Max: 99.8 F (37.7 C)  BP (!) 103/53 BP  Min: 103/53  Max: 125/58  HR 71 Pulse  Avg: 83.8  Min: 71  Max: 92  RR 18 Resp  Avg: 17.5  Min: 16  Max: 18  SaO2 100 % Not Delivered SpO2  Avg: 99.8 %  Min: 99 %  Max: 100 %       24 Hour I/O Current Shift I/O  Time Ins Outs No intake/output data recorded. No intake/output data recorded.    Patient Vitals for the past 24 hrs:  BP Temp Temp src Pulse Resp SpO2  12/22/16 0800 (!) 103/53 99 F (37.2 C) Oral 71 18 100 %  12/22/16 0550 106/63 97.9 F (36.6 C) Oral 77 18 100 %  12/22/16 0030 (!) 125/58 99.8 F (37.7 C) Oral 87 16 100 %  12/21/16 2050 115/68 98 F (36.7 C) Oral 89 17 99 %  12/21/16 1620 118/61 98.2 F (36.8 C) Oral 87 18 100 %  12/21/16 1320 - 98.6 F (37 C) Oral - - -  12/21/16 1139 (!) 104/53 99.6 F (37.6 C) Oral 92 18 100 %   Last temp: 7/28 @ 2100 39.2  Physical exam: General appearance: alert, cooperative and appears stated age Abdomen: soft, non-tender; bowel sounds normal; no masses,  no organomegaly GU: No gross VB Lungs: clear to auscultation bilaterally Heart: S1, S2 normal, no murmur, rub or gallop, regular rate and rhythm Extremities: no c/c/e Skin: warm and dry Psych: appropriate Neurologic: Grossly normal  Medications Current Facility-Administered Medications  Medication Dose Route  Frequency Provider Last Rate Last Dose  . acetaminophen (TYLENOL) tablet 1,000 mg  1,000 mg Oral Q6H PRN Constant, Peggy, MD   1,000 mg at 12/20/16 2227  . amoxicillin-clavulanate (AUGMENTIN) 875-125 MG per tablet 1 tablet  1 tablet Oral Q12H [place] BingPickens, Jerae Izard, MD   1 tablet at 12/22/16 0944  . ibuprofen (ADVIL,MOTRIN) tablet 600 mg  600 mg Oral Q6H PRN Constant, Peggy, MD   600 mg at 12/22/16 0048  . oxyCODONE-acetaminophen (PERCOCET/ROXICET) 5-325 MG per tablet 1 tablet  1 tablet Oral Q6H PRN [place] BingPickens, Rikki Smestad, MD          Labs  Lactic acid: normal @ 0.6  Recent Labs Lab 12/20/16 1659 12/21/16 0541 12/22/16 0728  WBC 15.7* 10.6* 7.8  HGB 11.6* 10.8* 10.1*  HCT 34.3* 31.4* 29.8*  PLT 200 180 181    Recent Labs Lab 12/20/16 1659 12/22/16 0728  NA 133* 138  K 3.0* 3.4*  CL 103 110  CO2 19* 23  BUN 7 5*  CREATININE 0.63 0.60  CALCIUM 8.3* 8.0*  PROT 7.1  --   BILITOT 1.1  --   ALKPHOS 42  --   ALT 12*  --   AST 22  --   GLUCOSE 141* 113*   BCx x  2: NGTD  Pending: GC/CT  Radiology: no new imaging They didn't state in the report but ES is 8-609mm and negative doppler flow, uniform and appears normal  CLINICAL DATA:  Right lower quadrant abdominal pain, first trimester of pregnancy.  EXAM: OBSTETRIC <14 WK US AND TRANSVAGINAL OB US  TECHNIQUE: Both transabdominal and transvaginal ultrasound examinations were performed for complete evaluation of the gestation as well as the maternal uterus, adnexal regions, and pelvic cul-de-sac. Transvaginal technique was performed to assess early pregnancy.  COMPARISON:  None.  FINDINGS: Intrauterine gestational sac: Not visualized.  Yolk sac:  Not visualized.  Embryo:  Not visualized.  Cardiac Activity: Not visualized.  Maternal uterus/adnexae: Right ovary appears normal. Trace free fluid is noted which most likely is physiologic. Probable corpus luteum cyst seen in left ovary.  IMPRESSION: No intrauterine  gestational sac, yolk sac, fetal pole, or cardiac activity visualized. Differential considerations include intrauterine gestation too early to be sonographically visualized, spontaneous abortion, or ectopic pregnancy. Consider follow-up ultrasound in 14 days and serial quantitative beta HCG follow-up.   Electronically Signed   By: Lupita RaiderJames  Green Jr, M.D.   On: 12/20/2016 17:10  Assessment & Plan:  Pt doing well *GYN: transition to augmentin bid today. D/c zosyn s/p 3d *Pain: no neeeds *FEN/GI: SLIV, regular diet *PPx: SCDs, OOB ad lib *Dispo: hopefully tomorrow after breakfast. D/w pt that would like to keep her on PO abx 24h while in house.   Code Status: Full Code  Total time taking care of the patient was 15 minutes, with greater than 50% of the time spent in face to face interaction with the patient.  Cornelia Copaharlie Kavya Haag, Jr. MD Attending Center [facility](Faculty Practice)

## 2016-12-23 DIAGNOSIS — A749 Chlamydial infection, unspecified: Secondary | ICD-10-CM

## 2016-12-23 MED ORDER — IBUPROFEN 600 MG PO TABS: 600.0000 mg | ORAL_TABLET | Freq: Four times a day (QID) | ORAL | 0 refills | Status: DC | PRN

## 2016-12-23 MED ORDER — ACETAMINOPHEN 500 MG PO TABS: 1000.0000 mg | ORAL_TABLET | Freq: Four times a day (QID) | ORAL | 0 refills | Status: AC | PRN

## 2016-12-23 MED ORDER — AMOXICILLIN-POT CLAVULANATE 875-125 MG PO TABS: 1.0000 | ORAL_TABLET | Freq: Two times a day (BID) | ORAL | 0 refills | Status: DC

## 2016-12-23 NOTE — Discharge Instructions (Signed)
Endometritis °Endometritis is irritation, soreness, or inflammation that affects the lining of the uterus (endometrium). °Infection is usually the cause of endometritis. It is important to get treatment to prevent complications. Common complications may include more severe infections and not being able to have children(infertility). °What are the causes? °This condition may be caused by: °· Bacterial infections. °· STIs (sexually transmitted infections). °· A miscarriage or childbirth, especially after a long labor or cesarean delivery. °· Certain gynecological procedures. These may include dilation and curettage (D&C), hysteroscopy, or birth control (contraceptive) insertion. °· Tuberculosis (TB). ° °What are the signs or symptoms? °Symptoms of this condition include: °· Fever. °· Lower abdomen (abdominal) pain. °· Pelvis (pelvic) pain. °· Abnormal vaginal discharge or bleeding. °· Abdominal bloating (distention) or swelling. °· General discomfort or generally feeling ill. °· Discomfort with bowel movements. °· Constipation. ° °How is this diagnosed? °This condition may be diagnosed based on: °· A physical exam, including a pelvic exam. °· Tests, such as: °? Blood tests. °? Removal of a sample of endometrial tissue for testing (endometrial biopsy). °? Examining a sample of vaginal discharge under a microscope (wet prep). °? Removal of a sample of fluid from the cervix for testing (cervical culture). °? Surgical examination of the pelvis and abdomen. ° °How is this treated? °This condition is treated with: °· Antibiotic medicines. °· For more severe cases, hospitalization may be needed to give fluids and antibiotics directly into a vein through an IV tube. ° °Follow these instructions at home: °· Take over-the-counter and prescription medicines only as told by your health care provider. °· Drink enough fluid to keep your urine clear or pale yellow. °· Take your antibiotic medicine as told by your health care  provider. Do not stop taking the antibiotic even if you start to feel better. °· Do not douche or have sex (including vaginal, oral, and anal sex) until your health care provider approves. °· If your endometritis was caused by an STI, do not have sex (including vaginal, oral, and anal sex) until your partner has also been treated for the STI. °· Return to your normal activities as told by your health care provider. Ask your health care provider what activities are safe for you. °· Keep all follow-up visits as told by your health care provider. This is important. °Contact a health care provider if: °· You have pain that does not get better with medicine. °· You have a fever. °· You have pain with bowel movements. °Get help right away if: °· You have abdominal swelling. °· You have abdominal pain that gets worse. °· You have bad-smelling vaginal discharge, or an increased amount of vaginal discharge. °· You have abnormal vaginal bleeding. °· You have nausea and vomiting. °Summary °· Endometritis affects the lining of the uterus (endometrium) and is usually caused by an infection. °· It is important to get treatment to prevent complications. °· You have several treatment options for endometritis. Treatment may include antibiotics and IV fluids. °· Take your antibiotic medicine as told by your health care provider. Do not stop taking the antibiotic even if you start to feel better. °· Do not douche or have sex (including vaginal, oral, and anal sex) until your health care provider approves. °This information is not intended to replace advice given to you by your health care provider. Make sure you discuss any questions you have with your health care provider. °Document Released: 05/06/2001 Document Revised: 05/27/2016 Document Reviewed: 05/27/2016 °Elsevier Interactive Patient Education © 2017   [employer] ° °

## 2016-12-23 NOTE — Progress Notes (Signed)
Pt is discharged in the care of friend. Downstairs per ambulatory. Stable. Denies any pain or discomfort. ,Heavy vaginal bleeding,Statesn she understands all discharged instructions well. Questions  Asked and answered  All questions.

## 2016-12-25 LAB — CULTURE, BLOOD (SINGLE)
Culture: NO GROWTH
Special Requests: ADEQUATE

## 2016-12-25 LAB — CULTURE, BLOOD (ROUTINE X 2)
CULTURE: NO GROWTH
Special Requests: ADEQUATE

## 2016-12-30 ENCOUNTER — Encounter: Payer: Medicaid Other | Admitting: Obstetrics and Gynecology

## 2017-01-21 ENCOUNTER — Encounter (HOSPITAL_COMMUNITY): Payer: Self-pay | Admitting: Emergency Medicine

## 2017-01-21 ENCOUNTER — Emergency Department (HOSPITAL_COMMUNITY)
Admission: EM | Admit: 2017-01-21 | Discharge: 2017-01-21 | Disposition: A | Discharge status: Home / Self Care | Payer: Medicaid Other | Attending: Emergency Medicine | Admitting: Emergency Medicine

## 2017-01-21 DIAGNOSIS — N938 Other specified abnormal uterine and vaginal bleeding: Secondary | ICD-10-CM | POA: Insufficient documentation

## 2017-01-21 DIAGNOSIS — Z5321 Procedure and treatment not carried out due to patient leaving prior to being seen by health care provider: Secondary | ICD-10-CM | POA: Insufficient documentation

## 2017-01-21 LAB — COMPREHENSIVE METABOLIC PANEL
ALBUMIN: 3.9 g/dL (ref 3.5–5.0)
ALK PHOS: 52 U/L (ref 38–126)
ALT: 13 U/L — AB (ref 14–54)
AST: 18 U/L (ref 15–41)
Anion gap: 7 (ref 5–15)
BILIRUBIN TOTAL: 0.7 mg/dL (ref 0.3–1.2)
BUN: 9 mg/dL (ref 6–20)
CALCIUM: 9.2 mg/dL (ref 8.9–10.3)
CO2: 25 mmol/L (ref 22–32)
CREATININE: 0.77 mg/dL (ref 0.44–1.00)
Chloride: 108 mmol/L (ref 101–111)
GFR calc Af Amer: >60 mL/min (ref >60)
GFR calc non Af Amer: >60 mL/min (ref >60)
GLUCOSE: 110 mg/dL — AB (ref 65–99)
Potassium: 3.7 mmol/L (ref 3.5–5.1)
SODIUM: 140 mmol/L (ref 135–145)
Total Protein: 7 g/dL (ref 6.5–8.1)

## 2017-01-21 LAB — CBC WITH DIFFERENTIAL/PLATELET
BASOS PCT: 0 %
Basophils Absolute: 0 10*3/uL (ref 0.0–0.1)
EOS ABS: 0.2 10*3/uL (ref 0.0–0.7)
Eosinophils Relative: 3 %
HEMATOCRIT: 37.8 % (ref 36.0–46.0)
HEMOGLOBIN: 12.6 g/dL (ref 12.0–15.0)
LYMPHS ABS: 2.1 10*3/uL (ref 0.7–4.0)
Lymphocytes Relative: 35 %
MCH: 29 pg (ref 26.0–34.0)
MCHC: 33.3 g/dL (ref 30.0–36.0)
MCV: 87.1 fL (ref 78.0–100.0)
Monocytes Absolute: 0.4 10*3/uL (ref 0.1–1.0)
Monocytes Relative: 7 %
NEUTROS ABS: 3.3 10*3/uL (ref 1.7–7.7)
NEUTROS PCT: 55 %
Platelets: 201 10*3/uL (ref 150–400)
RBC: 4.34 MIL/uL (ref 3.87–5.11)
RDW: 13.5 % (ref 11.5–15.5)
WBC: 6 10*3/uL (ref 4.0–10.5)

## 2017-01-21 LAB — I-STAT BETA HCG BLOOD, ED (MC, WL, AP ONLY): I-stat hCG, quantitative: 5.3 m[IU]/mL — ABNORMAL HIGH (ref <5)

## 2017-01-21 NOTE — ED Triage Notes (Signed)
Patient arrived with EMS reports vaginal bleeding onset last night , no abdominal cramping or fever .

## 2017-02-02 ENCOUNTER — Encounter: Payer: Self-pay | Admitting: Obstetrics and Gynecology

## 2017-02-02 ENCOUNTER — Ambulatory Visit (INDEPENDENT_AMBULATORY_CARE_PROVIDER_SITE_OTHER): Payer: Medicaid Other | Admitting: Obstetrics and Gynecology

## 2017-02-02 ENCOUNTER — Other Ambulatory Visit (HOSPITAL_COMMUNITY)
Admission: RE | Admit: 2017-02-02 | Discharge: 2017-02-02 | Disposition: A | Discharge status: Home / Self Care | Payer: Medicaid Other | Source: Ambulatory Visit | Attending: Obstetrics and Gynecology | Admitting: Obstetrics and Gynecology

## 2017-02-02 VITALS — BP 105/60 | HR 68 | Wt 171.1 lb

## 2017-02-02 DIAGNOSIS — Z3200 Encounter for pregnancy test, result unknown: Secondary | ICD-10-CM

## 2017-02-02 DIAGNOSIS — A749 Chlamydial infection, unspecified: Secondary | ICD-10-CM

## 2017-02-02 DIAGNOSIS — Z202 Contact with and (suspected) exposure to infections with a predominantly sexual mode of transmission: Secondary | ICD-10-CM

## 2017-02-02 DIAGNOSIS — N760 Acute vaginitis: Secondary | ICD-10-CM | POA: Diagnosis not present

## 2017-02-02 DIAGNOSIS — O035 Genital tract and pelvic infection following complete or unspecified spontaneous abortion: Secondary | ICD-10-CM | POA: Diagnosis not present

## 2017-02-02 DIAGNOSIS — B9689 Other specified bacterial agents as the cause of diseases classified elsewhere: Secondary | ICD-10-CM | POA: Insufficient documentation

## 2017-02-02 NOTE — Addendum Note (Signed)
Addended by: Cheree DittoGRAHAM, DEMETRICE A on: 02/02/2017 04:25 PM   Modules accepted: Orders

## 2017-02-02 NOTE — Progress Notes (Signed)
STD TESTING TODAY

## 2017-02-02 NOTE — Progress Notes (Signed)
Patient ID: Tracey Hill, female   DOB: May 28, 1990, 26 y.o.   MRN: [id number] Pt here for hospital follow up. Pt had medically induced AB with Cytotec. Developed endometritis. Was hospitalized from 7/28-7/31. Received Zosyn and discharged on Augmentin. Has completed.  Also was Dx with chlamydia during hospitalization.  Pt has been sexual active since being discharged. No contraception. Desires. Has used OCP's in the past.  LMP 01/13/17 and last IC was 01/30/17  She reports some vaginal discharge. Denies any vaginal bleeding, bowel or bladder dysfunction.  PE AF VSS Lungs clear Heart RRR Abd soft + BS GU: nl EGBUS, white discharge uterus small mobile non tender no masses  A/P Endometritis       Chlamydia       Contraception  STD testing completed today. Will check BHCG today as well. Pending results with start OCP's with next cycle U/R/b reviewed with pt.

## 2017-02-03 LAB — BETA HCG QUANT (REF LAB): hCG Quant: 2 m[IU]/mL

## 2017-02-04 LAB — CERVICOVAGINAL ANCILLARY ONLY
Bacterial vaginitis: POSITIVE — AB
Chlamydia: NEGATIVE
Neisseria Gonorrhea: NEGATIVE
Trichomonas: NEGATIVE

## 2017-02-05 ENCOUNTER — Telehealth: Payer: Self-pay | Admitting: *Deleted

## 2017-02-05 DIAGNOSIS — B9689 Other specified bacterial agents as the cause of diseases classified elsewhere: Secondary | ICD-10-CM

## 2017-02-05 DIAGNOSIS — N760 Acute vaginitis: Principal | ICD-10-CM

## 2017-02-05 DIAGNOSIS — Z30011 Encounter for initial prescription of contraceptive pills: Secondary | ICD-10-CM

## 2017-02-05 MED ORDER — METRONIDAZOLE 500 MG PO TABS: 500.0000 mg | ORAL_TABLET | Freq: Two times a day (BID) | ORAL | 0 refills | Status: AC

## 2017-02-05 MED ORDER — DESOGESTREL-ETHINYL ESTRADIOL 0.15-30 MG-MCG PO TABS: 1.0000 | ORAL_TABLET | Freq: Every day | ORAL | 11 refills | Status: AC

## 2017-02-05 NOTE — Telephone Encounter (Signed)
Fleet ContrasRachel called 02/04/17 pm an left a message requesting results.
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
